# Patient Record
Sex: Male | Born: 1965 | Race: White | Hispanic: No | Marital: Married | Smoking: Never smoker
Health system: Southern US, Community
[De-identification: ages and names within clinical notes are randomized; demographics above are authoritative.]

## PROBLEM LIST (undated history)

## (undated) DIAGNOSIS — N200 Calculus of kidney: Secondary | ICD-10-CM

## (undated) DIAGNOSIS — J189 Pneumonia, unspecified organism: Secondary | ICD-10-CM

## (undated) DIAGNOSIS — T8859XA Other complications of anesthesia, initial encounter: Secondary | ICD-10-CM

## (undated) DIAGNOSIS — G4730 Sleep apnea, unspecified: Secondary | ICD-10-CM

## (undated) DIAGNOSIS — I10 Essential (primary) hypertension: Secondary | ICD-10-CM

## (undated) DIAGNOSIS — T4145XA Adverse effect of unspecified anesthetic, initial encounter: Secondary | ICD-10-CM

## (undated) HISTORY — DX: Calculus of kidney: N20.0

## (undated) HISTORY — PX: CHOLECYSTECTOMY: SHX55

---

## 2008-02-13 ENCOUNTER — Ambulatory Visit: Payer: Self-pay | Admitting: Family Medicine

## 2008-02-13 DIAGNOSIS — L0231 Cutaneous abscess of buttock: Secondary | ICD-10-CM

## 2008-02-13 DIAGNOSIS — L03317 Cellulitis of buttock: Secondary | ICD-10-CM | POA: Insufficient documentation

## 2008-03-02 ENCOUNTER — Ambulatory Visit: Payer: Self-pay | Admitting: Family Medicine

## 2008-03-02 DIAGNOSIS — R197 Diarrhea, unspecified: Secondary | ICD-10-CM

## 2008-03-02 DIAGNOSIS — R112 Nausea with vomiting, unspecified: Secondary | ICD-10-CM

## 2008-03-03 ENCOUNTER — Inpatient Hospital Stay (HOSPITAL_COMMUNITY): Admission: EM | Admit: 2008-03-03 | Discharge: 2008-03-05 | Payer: Self-pay | Admitting: Internal Medicine

## 2008-03-03 ENCOUNTER — Encounter: Payer: Self-pay | Admitting: Emergency Medicine

## 2008-03-03 ENCOUNTER — Telehealth: Payer: Self-pay | Admitting: Family Medicine

## 2008-03-03 ENCOUNTER — Ambulatory Visit: Payer: Self-pay | Admitting: Diagnostic Radiology

## 2009-12-13 ENCOUNTER — Ambulatory Visit: Payer: Self-pay | Admitting: Emergency Medicine

## 2009-12-13 DIAGNOSIS — M25569 Pain in unspecified knee: Secondary | ICD-10-CM | POA: Insufficient documentation

## 2009-12-16 ENCOUNTER — Encounter: Admission: RE | Admit: 2009-12-16 | Discharge: 2010-01-18 | Payer: Self-pay | Admitting: Emergency Medicine

## 2009-12-20 ENCOUNTER — Encounter: Payer: Self-pay | Admitting: Emergency Medicine

## 2010-04-04 NOTE — Letter (Signed)
Summary: REFERRALL TO ORTHOPEDIC  REFERRALL TO ORTHOPEDIC   Imported By: Dannette Barbara 12/13/2009 11:33:14  _____________________________________________________________________  External Attachment:    Type:   Image     Comment:   External Document

## 2010-04-04 NOTE — Letter (Signed)
Summary: REHAB SUMMARY NOTES  REHAB SUMMARY NOTES   Imported By: Dannette Barbara 12/20/2009 16:52:20  _____________________________________________________________________  External Attachment:    Type:   Image     Comment:   External Document

## 2010-04-04 NOTE — Assessment & Plan Note (Signed)
Summary: LEFT KNEEN PAIN   Vital Signs:  Patient Profile:   45 Years Old Male CC:      left posterior knee pain x 4 weeks Height:     71 inches Weight:      285 pounds O2 Sat:      99 % O2 treatment:    Room Air Temp:     98.0 degrees F oral Pulse rate:   98 / minute Resp:     16 per minute BP sitting:   164 / 99  (left arm) Cuff size:   large  Pt. in pain?   yes    Location:   left knee    Type:       sharp  Vitals Entered By: Lajean Saver RN (December 13, 2009 10:19 AM)                   Updated Prior Medication List: No Medications Current Allergies (reviewed today): No known allergies History of Present Illness History from: patient Chief Complaint: left posterior knee pain x 4 weeks History of Present Illness: L knee pain for 4 weeks.  Was playing basketball but doesn't recall an injury.  Then a few days later developed pain on the back side of his knee that hasn't gone away.  Then yesterday was kicking outside, planted foot and felt further pain in the same area.  +swelling, popping.  no locking, giving way.  Aleve helps a little.   REVIEW OF SYSTEMS Constitutional Symptoms      Denies fever, chills, night sweats, weight loss, weight gain, and fatigue.  Eyes       Denies change in vision, eye pain, eye discharge, glasses, contact lenses, and eye surgery. Ear/Nose/Throat/Mouth       Denies hearing loss/aids, change in hearing, ear pain, ear discharge, dizziness, frequent runny nose, frequent nose bleeds, sinus problems, sore throat, hoarseness, and tooth pain or bleeding.  Respiratory       Denies dry cough, productive cough, wheezing, shortness of breath, asthma, bronchitis, and emphysema/COPD.  Cardiovascular       Denies murmurs, chest pain, and tires easily with exhertion.    Gastrointestinal       Denies stomach pain, nausea/vomiting, diarrhea, constipation, blood in bowel movements, and indigestion. Genitourniary       Denies painful urination, blood or  discharge from penis, kidney stones, and loss of urinary control. Neurological       Denies paralysis, seizures, and fainting/blackouts. Musculoskeletal       Complains of joint pain and joint stiffness.      Denies muscle pain, decreased range of motion, redness, swelling, muscle weakness, and gout.      Comments: left knee Skin       Denies bruising, unusual mles/lumps or sores, and hair/skin or nail changes.  Psych       Denies mood changes, temper/anger issues, anxiety/stress, speech problems, depression, and sleep problems. Other Comments: Patient played a game of basketball about 4 weeks ago. Ever since the next AM he has experienced left posterior knee pain daily. Worse in the evening.   Past History:  Past Medical History: Reviewed history from 02/13/2008 and no changes required. Kidney stones  Past Surgical History: Reviewed history from 02/13/2008 and no changes required. Cholecystectomy  Family History: Reviewed history from 02/13/2008 and no changes required. Mother, Healthy Father, Healthy Brothers, Healthy Sister, Healthy  Social History: Reviewed history from 02/13/2008 and no changes required. 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  Patella freely mobile, Clarks compression test normal.  Good alignment.  + Thessaly test.  +TTP along medial joint line and posteriormedial.  Assessment New Problems: KNEE PAIN, LEFT (ICD-719.46)  this is likely medial meniscus injury Xray is normal, slight loss of medial  joint space, + fabella  Patient Education: Patient and/or caregiver instructed in the following: rest, Tylenol prn, weight loss.  Plan New Medications/Changes: MELOXICAM 7.5 MG TABS (MELOXICAM) 1 by mouth two times a day for 10 days, then as needed  #40 x 0, 12/13/2009, Hoyt Koch MD  New Orders: Est. Patient Level IV [09811] T-DG Knee Complete 4 Views*L* [91478] Planning Comments:   Will send to PT for a few weeks.  Also to take Mobic. Ice frequently. Will send to [facility] in a few weeks for re-evaluation    The patient and/or caregiver has been counseled thoroughly with regard to medications prescribed including dosage, schedule, interactions, rationale for use, and possible side effects and they verbalize understanding.  Diagnoses and expected course of recovery discussed and will return if not improved as expected or if the condition worsens. Patient and/or caregiver verbalized understanding.  Prescriptions: MELOXICAM 7.5 MG TABS (MELOXICAM) 1 by mouth two times a day for 10 days, then as needed  #40 x 0   Entered and Authorized by:   Hoyt Koch MD   Signed by:   Hoyt Koch MD on 12/13/2009   Method used:   Print then Give to Patient   RxID:   [id number]   Orders Added: 1)  Est. Patient Level IV [62952] 2)  T-DG Knee Complete 4 Views*L* [84132]

## 2010-06-19 LAB — CBC
MCHC: 34.6 g/dL (ref 30.0–36.0)
MCV: 92.1 fL (ref 78.0–100.0)
Platelets: 210 10*3/uL (ref 150–400)
RDW: 13.1 % (ref 11.5–15.5)

## 2010-06-19 LAB — BASIC METABOLIC PANEL
BUN: 5 mg/dL — ABNORMAL LOW (ref 6–23)
CO2: 27 mEq/L (ref 19–32)
Chloride: 106 mEq/L (ref 96–112)
Creatinine, Ser: 0.92 mg/dL (ref 0.4–1.5)
Glucose, Bld: 105 mg/dL — ABNORMAL HIGH (ref 70–99)

## 2010-06-19 LAB — LIPASE, BLOOD: Lipase: 59 U/L (ref 11–59)

## 2010-06-19 LAB — CALCIUM, IONIZED: Calcium, Ion: 1.15 mmol/L (ref 1.12–1.32)

## 2010-07-18 NOTE — Discharge Summary (Signed)
Christopher Berry, STMARIE NO.:  [id number]   MEDICAL RECORD NO.:  [id number]          PATIENT TYPE:  INP   LOCATION:  1525                         FACILITY:  [facility]   PHYSICIAN:  Ladell Pier, M.D.   DATE OF BIRTH:  26-Dec-1965   DATE OF ADMISSION:  03/03/2008  DATE OF DISCHARGE:  03/05/2008                               DISCHARGE SUMMARY   DISCHARGE DIAGNOSES:  1. Partial small bowel obstruction.  2. Hypocalcemia.  3. Mildly elevated blood pressure.   DISCHARGE MEDICATIONS:  Phenergan p.r.n.   PROCEDURES:  None.   CONSULTANTS:  General surgery, Dr. Derrell Lolling.   HISTORY OF PRESENT ILLNESS:  The patient is a 45 year old Caucasian  gentleman with no significant past medical history except for remote  history of cholecystectomy.  Patient awakened from sleep about 4:00 a.m.  with diarrhea and vomiting.  He had two episode of profuse diarrhea,  since then he has been having colicky abdominal pain and vomiting.  He  has not changed his diet any.  Please see admission note for remainder  of history.   Past medical history, family history, social history, medications,  allergies, review of systems per admission H and P.   PHYSICAL EXAM:  Temperature 98, pulse of 86, respirations 18, blood  pressure 144/92, pulse ox 98% on room air.  GENERAL:  The patient lying on bed, does not seem to be in any acute  distress.  HEENT:  Normocephalic, atraumatic.  Pupils reactive to light.  Throat  without erythema.  CARDIOVASCULAR:  Regular rate and rhythm.  LUNGS:  Clear bilaterally.  ABDOMEN:  Positive bowel sounds.  Soft, nontender, nondistended.  EXTREMITIES:  Without edema.   HOSPITAL COURSE:  1. Partial small-bowel obstruction.  The patient was admitted to the      hospital, placed on IV fluids, made NPO.  General surgery was      consulted.  The patient had repeat x-ray done on January 1 that      shows substantial improvement in the small bowel distention.  The  patient's abdominal pain resolved.  He is able to tolerate a      regular diet without any nausea, vomiting or abdominal pain.  Per      general surgery's recommendation will discharge the patient home.      He will follow up with his primary care doctor in 1 week.  2. Mildly elevated LFTs.  Discussed this with the patient.  He will      also follow up with his doctor regarding mildly elevated LFTs for      repeat LFTs.  3. Anemia:  The patient's hemoglobin is 11.8, not sure if this is      hemodilution.  The patient will follow up with his primary care      doctor for outpatient workup of the anemia.  Of note, on admission      his hemoglobin was 15.7 though this probably is hemodilution since      he has been on lots of IV fluids.  4. Mildly elevated blood pressure:  The patient's  blood pressure has      one reading that was mildly elevated.  This also could be from the      increased IV fluids.  The patient will follow up with primary care      physician to recheck his blood pressure.   DISCHARGE LABS:  TSH 2.202, ionized calcium 1.15, lipase of 59, sodium  137, potassium 3.8, chloride 106, CO2 27, glucose 105, BUN 5, creatinine  0.92, calcium 8, WBC 4.2, hemoglobin 11.8, MCV 92.1, platelet 210,  magnesium of 1.9, urinalysis negative.  X-ray scan showed substantial  interval improvement in small bowel distention seen on yesterday's  study.  Initial x-ray showed loops mid abdomen with some air-fluid  levels consistent with partial small bowel obstruction or ileus.      Ladell Pier, M.D.  Electronically Signed     NJ/MEDQ  D:  03/05/2008  T:  03/06/2008  Job:  [id number]

## 2010-07-18 NOTE — Consult Note (Signed)
NAMEOBED, SAMEK NO.:  [id number]   MEDICAL RECORD NO.:  [id number]          PATIENT TYPE:  INP   LOCATION:  1525                         FACILITY:  [facility]   PHYSICIAN:  Angelia Mould. Derrell Lolling, M.D.DATE OF BIRTH:  [birth year]   DATE OF CONSULTATION:  DATE OF DISCHARGE:                                 CONSULTATION   REASON FOR CONSULTATION:  Evaluate abdominal pain, possible small bowel  obstruction.   HISTORY OF PRESENT ILLNESS:  This is a 45 year old white man, previously  healthy.  At 4:00 a.m. on Tuesday, December 29, he awoke at 4:00 a.m.  with sudden onset of large volume nonbloody diarrhea.  Was not having  any pain or nausea at that time.  Over the next hour or two, he had a  couple of large nonbloody stools.  Again, with no pain or fever.  Later  in the day, he developed repeated episodes of nausea and vomiting of  light bilious fluid.  Yesterday, he had three more episodes of nausea  and vomiting.  Continued to have loose stools, but they were of much  smaller volume.  He developed a constant soreness in his abdomen which  was not severe.  There was not colicky pain.  He has never had a problem  like this before.  Has not had any fever or chills.  Has been voiding  normally.  Has been breathing normally.  No cough.  He denies any recent  travel or any unusual diet.   He came to emergency room last night.  CT scan was obtained which showed  some mildly dilated small bowel, but no focal transition point or  inflammatory change.  Small-bowel obstruction could not be excluded.  Because of this, he was admitted by the medical team because they could  not exclude small-bowel obstruction.  They called me for consultation.   This morning, the patient feels much better and says that his pain is  almost gone and the nausea and vomiting has completely resolved and he  thinks that he could eat.   PAST HISTORY:  1. Laparoscopic cholecystectomy in the early  1990s.  2. Gluteal repair of rectal abscess in the past.  3. Otherwise, no medical or surgical problems.   MEDICATIONS:  None.   DRUG ALLERGIES:  None known.   SOCIAL HISTORY:  He has been previously married.  His girlfriend/fiance  is here with him now.  He denies alcohol or tobacco.  He has two  children.  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   FAMILY HISTORY:  Noncontributory.   REVIEW OF SYSTEMS:  All systems reviewed.  They are noncontributory  except as described above.   PHYSICAL EXAMINATION:  GENERAL:  Pleasant, large man in no distress.  Stated height 511, stated weight 271 pounds, temperature 97.1, heart  rate 86, respiratory rate 18, blood pressure 125/86, oxygen saturation  97% room air.  HEENT:  Eyes:  Sclerae clear.  Extraocular movements intact.  Ears,  Mouth, Throat, Nose, Tongue and Oropharynx are without gross lesions.  NECK:  Supple, nontender.  No obvious mass.  Very large neck.  No  jugular distention.  LUNGS:  Clear to auscultation.  No chest wall tenderness.  HEART:  Regular rate and rhythm.  No murmur.  Radial and femoral pulses  are palpable.  ABDOMEN:  Obese, fairly soft.  Bowel sounds are present, they do not  sound that abnormal.  A well-healed laparoscopy trocar site scars.  There are no masses.  There are no hernias in the abdomen or groin.  Liver, spleen are not enlarged.  He has mild diffuse tenderness to deep  palpation, but no peritoneal signs.  EXTREMITIES:  Moves all four extremities without pain or deformity.  NEUROLOGIC:  No gross motor sensory deficits.   ADMISSION DATA:  CT scan described above.  Hemoglobin was 15.7 yesterday  and is 11.9 this morning.  White blood cell count is 5900.  Complete  metabolic panel is normal except for mild elevation of SGOT and SGPT.  Lipase is 80.   ASSESSMENT:  1. Abdominal pain, nausea, vomiting, diarrhea.  Clinically,      gastroenteritis seems much more likely than small-bowel       obstruction.  2. Mild elevation of liver function test of uncertain etiology.   RECOMMENDATIONS:  1. I would repeat his abdominal x-rays today, and if they have      improved, I would allow liquid diet.  2. I would repeat lab work and abdominal x-rays tomorrow morning, and      if he remains improving, he probably could go home.  I will follow      along with you.  3. There is no indication for surgical intervention at this time.      Angelia Mould. Derrell Lolling, M.D.  Electronically Signed     HMI/MEDQ  D:  03/04/2008  T:  03/04/2008  Job:  [id number]   cc:   Dr. Sonny Masters Internal Medicine Team

## 2010-07-18 NOTE — H&P (Signed)
Christopher Berry, HINEY.:  [id number]   MEDICAL RECORD NO.:  [id number]          PATIENT TYPE:  INP   LOCATION:  1525                         FACILITY:  [facility]   PHYSICIAN:  Vania Rea, M.D. DATE OF BIRTH:  04-22-1965   DATE OF ADMISSION:  03/03/2008  DATE OF DISCHARGE:                              HISTORY & PHYSICAL   PRIMARY CARE PHYSICIAN:  Unassigned.   CHIEF COMPLAINT:  Abdominal pain and vomiting since yesterday morning.   HISTORY OF PRESENT ILLNESS:  This is a 45 year old, obese, Caucasian  gentleman with no significant past medical history other than remote  cholecystectomy about 20 years ago who was in good baseline state of  health until he was awakened from sleep about 4 a.m. yesterday morning  with diarrhea and vomiting.  The patient had two episodes of perfuse  diarrhea and since then has been having colicky abdominal pain and  vomiting with passage of scant amounts of stool.  The patient lives with  his girlfriend.  They have been eating pretty much the same thing.  She  has no symptoms.  The patient went to an Urgent Care facility where he  received an injection of Phenergan as well as Phenergan p.r.n.  This  gave no relief and he went to the freestanding emergency room at [facility] where he was evaluated.  A CT scan of the abdomen was done which  showed partial small bowel obstruction and the hospitalist service was  contacted for admission.   The patient has no fever, cough or cold and denies chest pain.  He has  been passing reduced amounts of urine and has been having dysuria.   He has no history of diabetes but his younger brother does have  diabetes.   PAST MEDICAL HISTORY:  Nil.   PAST SURGICAL HISTORY:  Laparoscopic cholecystectomy.   MEDICATIONS:  Phenergan for the past 24 hours; otherwise nil.   ALLERGIES:  No known drug allergies.   SOCIAL HISTORY:  Denies tobacco, alcohol or illicit drug use.  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   FAMILY HISTORY:  Significant only for a younger brother with diabetes,  type 1.   REVIEW OF SYSTEMS:  Other than noted above, a 10-point review of systems  is unremarkable.   PHYSICAL EXAMINATION:  GENERAL:  Obese, young, Caucasian gentleman,  lying in the stretcher.  Appears to be in some painful distress,  especially when he tries to sit up.  VITAL SIGNS:  Temperature 97,  pulse 96, respirations 18, blood pressure  143/85.  Oxygen saturation 97% on room air.  HEENT:  Pupils are round and equal.  Mucous membranes are pink and  anicteric.  He is not dehydrated.  He has received already 4 L of fluid.  NECK:  He has no cervical lymphadenopathy.  He has a thick neck.  No  jugular venous distension appreciated.  No carotid bruits.  CHEST:  Clear to auscultation bilaterally.  CARDIOVASCULAR:  Regular rhythm.  ABDOMEN:  Diffusely tender.  Distended by fat.  He appears  to have  somewhat decreased bowel sounds.  No masses are appreciated.  Umbilicus  is not inverted.  EXTREMITIES:  Without edema.  He has 2+ dorsalis pedis pulses  bilaterally.  He has no bony joint deformity and no skin lesions.  CENTRAL NERVOUS SYSTEM:  Cranial nerves II-XII grossly intact.  He has  no focal neurologic deficits.   LABORATORY DATA:  CBC is unremarkable.  He has no leukocytosis.  No  thrombocytosis.  He has a normal differential.  Serum chemistry is  likewise unremarkable with the exception of a BUN of 15 and a creatinine  of 1.4.  Otherwise his chemistry is normal.  Serum glucose is 140.  Serum lipase is 80.  His urinalysis showed specific gravity of 1.025, 15  ketones, 30 of protein  and is otherwise unremarkable.  CT scan of the  abdomen and pelvis with IV contrast shows findings compatible with early  partial small bowel obstruction.  Specific site or etiology is not  demonstrated.  No evidence of perforation or abscess. There is a non-  obstructing right renal  calculus. Hepatic steatosis noted.  Small  nodular densities at both lung bases, likely post inflammatory.  CT scan  of the pelvis shows no significant pelvic findings.   ASSESSMENT:  1. Partial small bowel obstruction.  2. Gastroenteritis less likely.   PLAN:  Will admit this gentleman for continued hydration. Keep him NPO  and pass an NG tube for decompression of the gastrointestinal tract.  When drainage has subsided, the patient can be started on a liquid diet.  Will go ahead and  consult surgical service early for co-management.  Will check his hemoglobin A1c because of family history of  hyperglycemia.  His urinalysis and urine microscopy are not suggestive  of infection and we probably will not investigate it further.  Other  plans as per orders.      Vania Rea, M.D.  Electronically Signed     LC/MEDQ  D:  03/03/2008  T:  03/04/2008  Job:  [id number]

## 2010-12-08 LAB — COMPREHENSIVE METABOLIC PANEL
Alkaline Phosphatase: 67 U/L (ref 39–117)
BUN: 15 mg/dL (ref 6–23)
Glucose, Bld: 140 mg/dL — ABNORMAL HIGH (ref 70–99)
Potassium: 3.7 mEq/L (ref 3.5–5.1)
Total Bilirubin: 0.9 mg/dL (ref 0.3–1.2)
Total Protein: 8.3 g/dL (ref 6.0–8.3)

## 2010-12-08 LAB — CBC
HCT: 35 % — ABNORMAL LOW (ref 39.0–52.0)
HCT: 46.8 % (ref 39.0–52.0)
Hemoglobin: 11.9 g/dL — ABNORMAL LOW (ref 13.0–17.0)
Hemoglobin: 15.7 g/dL (ref 13.0–17.0)
MCHC: 33.5 g/dL (ref 30.0–36.0)
MCHC: 34.1 g/dL (ref 30.0–36.0)
MCV: 91.1 fL (ref 78.0–100.0)
RDW: 12.8 % (ref 11.5–15.5)
RDW: 13.3 % (ref 11.5–15.5)

## 2010-12-08 LAB — DIFFERENTIAL
Basophils Absolute: 0.2 10*3/uL — ABNORMAL HIGH (ref 0.0–0.1)
Basophils Relative: 2 % — ABNORMAL HIGH (ref 0–1)
Monocytes Relative: 6 % (ref 3–12)
Neutro Abs: 7.5 10*3/uL (ref 1.7–7.7)
Neutrophils Relative %: 76 % (ref 43–77)

## 2010-12-08 LAB — BASIC METABOLIC PANEL
CO2: 27 mEq/L (ref 19–32)
Glucose, Bld: 108 mg/dL — ABNORMAL HIGH (ref 70–99)
Potassium: 4.3 mEq/L (ref 3.5–5.1)
Sodium: 138 mEq/L (ref 135–145)

## 2010-12-08 LAB — URINALYSIS, ROUTINE W REFLEX MICROSCOPIC
Leukocytes, UA: NEGATIVE
Nitrite: NEGATIVE
Protein, ur: 30 mg/dL — AB
Urobilinogen, UA: 0.2 mg/dL (ref 0.0–1.0)

## 2010-12-08 LAB — URINE MICROSCOPIC-ADD ON

## 2011-02-15 ENCOUNTER — Encounter: Payer: Self-pay | Admitting: Family Medicine

## 2011-02-20 ENCOUNTER — Encounter: Payer: Self-pay | Admitting: Family Medicine

## 2011-02-20 ENCOUNTER — Ambulatory Visit (INDEPENDENT_AMBULATORY_CARE_PROVIDER_SITE_OTHER): Payer: BC Managed Care – PPO | Admitting: Family Medicine

## 2011-02-20 VITALS — BP 136/78 | HR 125 | Wt 293.0 lb

## 2011-02-20 DIAGNOSIS — Z0000 Encounter for general adult medical examination without abnormal findings: Secondary | ICD-10-CM

## 2011-02-20 NOTE — Progress Notes (Signed)
Subjective:    Patient ID: Christopher Berry, male    DOB: [birth year], 45 y.o.   MRN: [id number]  HPI Here to estab care and for CPE. Lesion on right buttock check for 4 year. Will spontaneously drina on its own from time to time. He says that he avoids touching it he can often go for 6 months without any problems. But as soon as he touched it,the next day it is swollen red and infected. He does sit and drive for long periods and says this does tend to aggravate it.   Has gained a lot of weight in the last 4 years after going to a sendentary job and drinks a lot. Works a full time and part time job. He is also concerned about his weight today. He wants to discuss some strategies to help him lose weight. He is not getting regular exercise. He drinks about 6 diet sodas per day. He admits he eats very poorly and often eats related night.     Review of Systems  Constitutional: Negative for fever, diaphoresis and unexpected weight change.  HENT: Negative for hearing loss, rhinorrhea, sneezing and tinnitus.   Eyes: Negative for visual disturbance.  Respiratory: Negative for cough and wheezing.   Cardiovascular: Negative for chest pain and palpitations.  Gastrointestinal: Negative for nausea, vomiting, diarrhea and blood in stool.  Genitourinary: Negative for dysuria and discharge.  Musculoskeletal: Positive for myalgias and arthralgias. Negative for joint swelling.  Skin: Negative for rash.  Neurological: Negative for headaches.  Hematological: Negative for adenopathy.  Psychiatric/Behavioral: Negative for sleep disturbance and dysphoric mood. The patient is not nervous/anxious.        BP 136/78  Pulse 125  Wt 293 lb (132.904 kg)    No Known Allergies  Past Medical History  Diagnosis Date  . Kidney stones     Past Surgical History  Procedure Date  . Cholecystectomy     in his 8s    History   Social History  . Marital Status: Single    Spouse Name: N/A    Number of Children: 2    . Years of Education: GED   Occupational History  . Scientist, [employer].    Social History Main Topics  . Smoking status: Never Smoker   . Smokeless tobacco: Not on file  . Alcohol Use: No  . Drug Use: No  . Sexually Active: Yes -- Male partner(s)   Other Topics Concern  . Not on file   Social History Narrative   6 cans of soda a day.  No regular exercise.  Divorced.      Family History  Problem Relation Age of Onset  . Diabetes Mother   . Diabetes Father   . Diabetes Brother     Objective:   Physical Exam  Constitutional: He is oriented to person, place, and time. He appears well-developed and well-nourished.  HENT:  Head: Normocephalic and atraumatic.  Right Ear: External ear normal.  Left Ear: External ear normal.  Nose: Nose normal.  Mouth/Throat: Oropharynx is clear and moist.  Eyes: Conjunctivae and EOM are normal. Pupils are equal, round, and reactive to light.  Neck: Normal range of motion. Neck supple. No thyromegaly present.  Cardiovascular: Normal rate, regular rhythm, normal heart sounds and intact distal pulses.   Pulmonary/Chest: Effort normal and breath sounds normal.  Abdominal: Soft. Bowel sounds are normal. He exhibits no distension and no mass. There is no tenderness. There is no rebound  and no guarding.  Musculoskeletal: Normal range of motion.  Lymphadenopathy:    He has no cervical adenopathy.  Neurological: He is alert and oriented to person, place, and time. He has normal reflexes.  Skin: Skin is warm and dry.       On his right inner buttock cheek approximately 2-3 cm from the gluteal crease he has a scarred indented area. Actually there are 2 of these areas right next to each other. There is no active drainage or edema or erythema. No active drainage. No rash or streaking. There is some hyperpigmentation of the skin there.  Psychiatric: He has a normal mood and affect. His behavior is normal. Judgment and thought content normal.           Assessment & Plan:  CPE - his exam is normal today except for the fact that he is overweight. Start a regular exercise program and make sure you are eating a healthy diet Try to eat 4 servings of dairy a day or take a calcium supplement (500mg  twice a day). Your vaccines are up to date.  Due for screening labs.   Obesity-we discussed working on cutting out the soda even though it is diet soda. We also discussed trying to establish some type of exercise routine. Start with 1-2 days per week for 15-20 minutes and then slowly build up. Again he works full-time in an additional part-time job so it does make it very difficult for him to find time to work out I explained this is extremely important. We also discussed working on calorie counting. There are some great prolapse also help retract her calories that can be helpful. We can also consider nutrition management if he is interested.  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 I discussed the option of seeing a general surgeon for removal. 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 He says he would like to think about it and let me know.

## 2011-02-20 NOTE — Patient Instructions (Signed)
Start a regular exercise program and make sure you are eating a healthy diet Try to eat 4 servings of dairy a day or take a calcium supplement (500mg twice a day). Your vaccines are up to date.   

## 2011-02-21 LAB — LIPID PANEL
Cholesterol: 153 mg/dL (ref 0–200)
Total CHOL/HDL Ratio: 3.8 Ratio

## 2011-02-21 LAB — COMPLETE METABOLIC PANEL WITH GFR
ALT: 35 U/L (ref 0–53)
AST: 17 U/L (ref 0–37)
Albumin: 4.5 g/dL (ref 3.5–5.2)
Alkaline Phosphatase: 55 U/L (ref 39–117)
BUN: 13 mg/dL (ref 6–23)
CO2: 26 mEq/L (ref 19–32)
Calcium: 9.1 mg/dL (ref 8.4–10.5)
Chloride: 105 mEq/L (ref 96–112)
Creat: 1.12 mg/dL (ref 0.50–1.35)
GFR, Est African American: >89 mL/min
GFR, Est Non African American: 79 mL/min
Glucose, Bld: 90 mg/dL (ref 70–99)
Potassium: 4.3 mEq/L (ref 3.5–5.3)
Sodium: 140 mEq/L (ref 135–145)
Total Bilirubin: 0.5 mg/dL (ref 0.3–1.2)
Total Protein: 6.8 g/dL (ref 6.0–8.3)

## 2012-05-01 ENCOUNTER — Emergency Department (INDEPENDENT_AMBULATORY_CARE_PROVIDER_SITE_OTHER): Payer: BC Managed Care – PPO

## 2012-05-01 ENCOUNTER — Other Ambulatory Visit (HOSPITAL_COMMUNITY): Payer: Self-pay | Admitting: Orthopaedic Surgery

## 2012-05-01 ENCOUNTER — Emergency Department
Admission: EM | Admit: 2012-05-01 | Discharge: 2012-05-01 | Disposition: A | Discharge status: Home / Self Care | Payer: BC Managed Care – PPO | Source: Home / Self Care | Attending: Family Medicine | Admitting: Family Medicine

## 2012-05-01 ENCOUNTER — Encounter: Payer: Self-pay | Admitting: Emergency Medicine

## 2012-05-01 ENCOUNTER — Encounter (HOSPITAL_COMMUNITY): Payer: Self-pay | Admitting: Respiratory Therapy

## 2012-05-01 ENCOUNTER — Ambulatory Visit: Payer: BC Managed Care – PPO | Admitting: Family Medicine

## 2012-05-01 DIAGNOSIS — J029 Acute pharyngitis, unspecified: Secondary | ICD-10-CM

## 2012-05-01 DIAGNOSIS — Z0389 Encounter for observation for other suspected diseases and conditions ruled out: Secondary | ICD-10-CM

## 2012-05-01 MED ORDER — PREDNISONE 50 MG PO TABS: ORAL_TABLET | ORAL | Status: DC

## 2012-05-01 MED ORDER — CEFTRIAXONE SODIUM 1 G IJ SOLR
1.0000 g | Freq: Once | INTRAMUSCULAR | Status: AC
  Administered 2012-05-01: 1 g via INTRAMUSCULAR

## 2012-05-01 NOTE — ED Provider Notes (Signed)
History     CSN: [id number]  Arrival date & time 05/01/12  1618   First MD Initiated Contact with Patient 05/01/12 1623      Chief Complaint  Patient presents with  . Sore Throat    HPI  Patient presents today with sore throat x3 days. Patient is known to have broken his arm earlier in the week. Patient was placed on ibuprofen and Percocet. Patient states that he took ibuprofen he noticed sore throat as well as generalized neck swelling. Patient stopped the medication. Swelling improved status post Benadryl use. However her sore throat has persisted. Patient denies any fevers or chills. Patient does have some difficulty with swallowing. No shortness of breath no drooling trismus. No nuchal rigidity.  Past Medical History  Diagnosis Date  . Kidney stones     Past Surgical History  Procedure Laterality Date  . Cholecystectomy      in his 74s    Family History  Problem Relation Age of Onset  . Diabetes Mother   . Diabetes Father   . Diabetes Brother     History  Substance Use Topics  . Smoking status: Never Smoker   . Smokeless tobacco: Not on file  . Alcohol Use: No      Review of Systems  All other systems reviewed and are negative.    Allergies  Codeine  Home Medications   Current Outpatient Rx  Name  Route  Sig  Dispense  Refill  . diphenhydrAMINE (BENADRYL) 25 mg capsule   Oral   Take 25 mg by mouth every 6 (six) hours as needed for itching.         Marland Kitchen oxyCODONE-acetaminophen (PERCOCET/ROXICET) 5-325 MG per tablet   Oral   Take 1 tablet by mouth every 4 (four) hours as needed for pain.           BP 150/107  Pulse 94  Temp(Src) 98.1 F (36.7 C) (Oral)  Ht 6' (1.829 m)  Wt 278 lb (126.1 kg)  BMI 37.7 kg/m2  SpO2 97%  Physical Exam  Constitutional: He appears well-developed and well-nourished.  HENT:  Head: Normocephalic and atraumatic.  Right Ear: External ear normal.  Left Ear: External ear normal.  + post oropharyngeal erythema and  mild soft tissue swelling  No uvula deviation    Eyes: Conjunctivae are normal. Pupils are equal, round, and reactive to light.  Neck: Normal range of motion. Neck supple.  Cardiovascular: Normal rate, regular rhythm and normal heart sounds.   Pulmonary/Chest: Effort normal and breath sounds normal.  Abdominal: Soft.  Musculoskeletal: Normal range of motion.  Lymphadenopathy:    He has no cervical adenopathy.  Neurological: He is alert.  Skin: Skin is warm.    ED Course  Procedures (including critical care time)  Labs Reviewed  POCT RAPID STREP A (OFFICE)   Dg Neck Soft Tissue  05/01/2012  *RADIOLOGY REPORT*  Clinical Data: Pharyngitis. Evaluate for possible epiglottitis.  NECK SOFT TISSUES - 1+ VIEW  Comparison:  None.  Findings:  There is no evidence of retropharyngeal soft tissue swelling or epiglottic enlargement.  The cervical airway is unremarkable and no radio-opaque foreign body identified.  IMPRESSION: Negative.   Original Report Authenticated By: Davonna Belling, M.D.      1. Sore throat       MDM  No imaging evidence of epiglottitis.  Rapid strep negative.  Will cover for infectious and non infectious sources with rocephin (1gm IMx1) and prednisone with plan for ENT reeval  in am.  Discussed ENT and airway red flags at length that would warrant emergent evaluation.  Follow up as needed.     The patient and/or caregiver has been counseled thoroughly with regard to treatment plan and/or medications prescribed including dosage, schedule, interactions, rationale for use, and possible side effects and they verbalize understanding. Diagnoses and expected course of recovery discussed and will return if not improved as expected or if the condition worsens. Patient and/or caregiver verbalized understanding.             Doree Albee, MD 05/01/12 [phone]

## 2012-05-01 NOTE — ED Notes (Signed)
Sore throat x 3 days

## 2012-05-04 ENCOUNTER — Telehealth: Payer: Self-pay | Admitting: Emergency Medicine

## 2012-05-05 ENCOUNTER — Encounter (HOSPITAL_COMMUNITY): Payer: Self-pay | Admitting: Surgery

## 2012-05-05 MED ORDER — DEXTROSE 5 % IV SOLN
3.0000 g | INTRAVENOUS | Status: AC
  Administered 2012-05-06: 3 g via INTRAVENOUS
  Filled 2012-05-05: qty 3000

## 2012-05-05 NOTE — Progress Notes (Signed)
Call completed. Patient denied having a stress test, cardiac cath, or sleep study. PCP is Dr. Nani Gasser; encounters in [facility].

## 2012-05-06 ENCOUNTER — Encounter (HOSPITAL_COMMUNITY): Payer: Self-pay | Admitting: *Deleted

## 2012-05-06 ENCOUNTER — Ambulatory Visit (HOSPITAL_COMMUNITY): Payer: Worker's Compensation | Admitting: Anesthesiology

## 2012-05-06 ENCOUNTER — Encounter (HOSPITAL_COMMUNITY): Payer: Self-pay | Admitting: Anesthesiology

## 2012-05-06 ENCOUNTER — Encounter (HOSPITAL_COMMUNITY)
Admission: RE | Disposition: A | Discharge status: Home / Self Care | Payer: Self-pay | Source: Ambulatory Visit | Attending: Orthopaedic Surgery

## 2012-05-06 ENCOUNTER — Observation Stay (HOSPITAL_COMMUNITY)
Admission: RE | Admit: 2012-05-06 | Discharge: 2012-05-07 | Disposition: A | Discharge status: Home / Self Care | Payer: Worker's Compensation | Source: Ambulatory Visit | Attending: Orthopaedic Surgery | Admitting: Orthopaedic Surgery

## 2012-05-06 DIAGNOSIS — S52501A Unspecified fracture of the lower end of right radius, initial encounter for closed fracture: Secondary | ICD-10-CM

## 2012-05-06 DIAGNOSIS — W1789XA Other fall from one level to another, initial encounter: Secondary | ICD-10-CM | POA: Insufficient documentation

## 2012-05-06 DIAGNOSIS — Y990 Civilian activity done for income or pay: Secondary | ICD-10-CM | POA: Insufficient documentation

## 2012-05-06 DIAGNOSIS — S52599A Other fractures of lower end of unspecified radius, initial encounter for closed fracture: Principal | ICD-10-CM | POA: Insufficient documentation

## 2012-05-06 HISTORY — PX: OPEN REDUCTION INTERNAL FIXATION (ORIF) DISTAL RADIAL FRACTURE: SHX5989

## 2012-05-06 HISTORY — DX: Adverse effect of unspecified anesthetic, initial encounter: T41.45XA

## 2012-05-06 HISTORY — DX: Pneumonia, unspecified organism: J18.9

## 2012-05-06 HISTORY — DX: Other complications of anesthesia, initial encounter: T88.59XA

## 2012-05-06 LAB — SURGICAL PCR SCREEN: MRSA, PCR: INVALID — AB

## 2012-05-06 LAB — CBC
Hemoglobin: 14.2 g/dL (ref 13.0–17.0)
MCH: 31.3 pg (ref 26.0–34.0)
Platelets: 366 10*3/uL (ref 150–400)
RBC: 4.53 MIL/uL (ref 4.22–5.81)
WBC: 14 10*3/uL — ABNORMAL HIGH (ref 4.0–10.5)

## 2012-05-06 SURGERY — OPEN REDUCTION INTERNAL FIXATION (ORIF) DISTAL RADIUS FRACTURE
Anesthesia: General | Laterality: Right | Wound class: Clean

## 2012-05-06 MED ORDER — ACETAMINOPHEN 10 MG/ML IV SOLN
INTRAVENOUS | Status: AC
  Administered 2012-05-06: 1000 mg via INTRAVENOUS
  Filled 2012-05-06: qty 100

## 2012-05-06 MED ORDER — HYDROMORPHONE HCL PF 1 MG/ML IJ SOLN
INTRAMUSCULAR | Status: AC
  Filled 2012-05-06: qty 1

## 2012-05-06 MED ORDER — SODIUM CHLORIDE 0.9 % IV SOLN
INTRAVENOUS | Status: DC
  Administered 2012-05-06: 21:00:00 via INTRAVENOUS

## 2012-05-06 MED ORDER — MUPIROCIN 2 % EX OINT
TOPICAL_OINTMENT | CUTANEOUS | Status: AC
  Filled 2012-05-06: qty 22

## 2012-05-06 MED ORDER — METHOCARBAMOL 500 MG PO TABS
ORAL_TABLET | ORAL | Status: AC
  Filled 2012-05-06: qty 1

## 2012-05-06 MED ORDER — ROCURONIUM BROMIDE 100 MG/10ML IV SOLN
INTRAVENOUS | Status: DC | PRN
  Administered 2012-05-06: 30 mg via INTRAVENOUS

## 2012-05-06 MED ORDER — OXYCODONE HCL 5 MG PO TABS
ORAL_TABLET | ORAL | Status: AC
  Filled 2012-05-06: qty 1

## 2012-05-06 MED ORDER — NEOSTIGMINE METHYLSULFATE 1 MG/ML IJ SOLN
INTRAMUSCULAR | Status: DC | PRN
  Administered 2012-05-06: 3 mg via INTRAVENOUS

## 2012-05-06 MED ORDER — BIOTENE DRY MOUTH MT LIQD
15.0000 mL | Freq: Two times a day (BID) | OROMUCOSAL | Status: DC
  Administered 2012-05-07: 15 mL via OROMUCOSAL

## 2012-05-06 MED ORDER — LACTATED RINGERS IV SOLN
INTRAVENOUS | Status: DC
  Administered 2012-05-06 (×3): via INTRAVENOUS

## 2012-05-06 MED ORDER — HYDROCODONE-ACETAMINOPHEN 5-325 MG PO TABS
1.0000 | ORAL_TABLET | ORAL | Status: DC | PRN
  Administered 2012-05-06 – 2012-05-07 (×3): 2 via ORAL
  Filled 2012-05-06 (×3): qty 2

## 2012-05-06 MED ORDER — CEFAZOLIN SODIUM 1-5 GM-% IV SOLN
1.0000 g | Freq: Four times a day (QID) | INTRAVENOUS | Status: DC
  Administered 2012-05-06 – 2012-05-07 (×2): 1 g via INTRAVENOUS
  Filled 2012-05-06 (×3): qty 50

## 2012-05-06 MED ORDER — BUPIVACAINE HCL (PF) 0.25 % IJ SOLN
INTRAMUSCULAR | Status: AC
  Filled 2012-05-06: qty 30

## 2012-05-06 MED ORDER — ZOLPIDEM TARTRATE 5 MG PO TABS: 5.0000 mg | ORAL_TABLET | Freq: Every evening | ORAL | Status: DC | PRN

## 2012-05-06 MED ORDER — METHOCARBAMOL 500 MG PO TABS
500.0000 mg | ORAL_TABLET | Freq: Four times a day (QID) | ORAL | Status: DC | PRN
  Administered 2012-05-06: 500 mg via ORAL
  Filled 2012-05-06: qty 1

## 2012-05-06 MED ORDER — LIDOCAINE HCL 1 % IJ SOLN
INTRAMUSCULAR | Status: DC | PRN
  Administered 2012-05-06: 80 mg via INTRADERMAL

## 2012-05-06 MED ORDER — ONDANSETRON HCL 4 MG/2ML IJ SOLN: 4.0000 mg | Freq: Four times a day (QID) | INTRAMUSCULAR | Status: DC | PRN

## 2012-05-06 MED ORDER — HYDROMORPHONE HCL PF 1 MG/ML IJ SOLN
0.2500 mg | INTRAMUSCULAR | Status: DC | PRN
  Administered 2012-05-06 (×2): 0.5 mg via INTRAVENOUS

## 2012-05-06 MED ORDER — MIDAZOLAM HCL 5 MG/5ML IJ SOLN
INTRAMUSCULAR | Status: DC | PRN
  Administered 2012-05-06: 2 mg via INTRAVENOUS

## 2012-05-06 MED ORDER — ONDANSETRON HCL 4 MG/2ML IJ SOLN: 4.0000 mg | Freq: Once | INTRAMUSCULAR | Status: DC | PRN

## 2012-05-06 MED ORDER — PROPOFOL 10 MG/ML IV BOLUS
INTRAVENOUS | Status: DC | PRN
  Administered 2012-05-06: 300 mg via INTRAVENOUS

## 2012-05-06 MED ORDER — METOCLOPRAMIDE HCL 5 MG/ML IJ SOLN
5.0000 mg | Freq: Three times a day (TID) | INTRAMUSCULAR | Status: DC | PRN
  Filled 2012-05-06: qty 2

## 2012-05-06 MED ORDER — METOCLOPRAMIDE HCL 10 MG PO TABS: 5.0000 mg | ORAL_TABLET | Freq: Three times a day (TID) | ORAL | Status: DC | PRN

## 2012-05-06 MED ORDER — FENTANYL CITRATE 0.05 MG/ML IJ SOLN
INTRAMUSCULAR | Status: DC | PRN
  Administered 2012-05-06 (×2): 50 ug via INTRAVENOUS
  Administered 2012-05-06: 150 ug via INTRAVENOUS

## 2012-05-06 MED ORDER — OXYCODONE HCL 5 MG PO TABS
5.0000 mg | ORAL_TABLET | ORAL | Status: DC | PRN
  Administered 2012-05-06: 5 mg via ORAL

## 2012-05-06 MED ORDER — DIPHENHYDRAMINE HCL 12.5 MG/5ML PO ELIX
12.5000 mg | ORAL_SOLUTION | ORAL | Status: DC | PRN
  Filled 2012-05-06: qty 10

## 2012-05-06 MED ORDER — MORPHINE SULFATE 2 MG/ML IJ SOLN: 1.0000 mg | INTRAMUSCULAR | Status: DC | PRN

## 2012-05-06 MED ORDER — METHOCARBAMOL 100 MG/ML IJ SOLN
500.0000 mg | Freq: Four times a day (QID) | INTRAVENOUS | Status: DC | PRN
  Filled 2012-05-06: qty 5

## 2012-05-06 MED ORDER — ONDANSETRON HCL 4 MG/2ML IJ SOLN
INTRAMUSCULAR | Status: DC | PRN
  Administered 2012-05-06: 4 mg via INTRAVENOUS

## 2012-05-06 MED ORDER — BUPIVACAINE HCL (PF) 0.25 % IJ SOLN
INTRAMUSCULAR | Status: DC | PRN
  Administered 2012-05-06: 10 mL

## 2012-05-06 MED ORDER — GLYCOPYRROLATE 0.2 MG/ML IJ SOLN
INTRAMUSCULAR | Status: DC | PRN
  Administered 2012-05-06: 0.4 mg via INTRAVENOUS

## 2012-05-06 MED ORDER — MORPHINE SULFATE 10 MG/ML IJ SOLN
INTRAMUSCULAR | Status: DC | PRN
  Administered 2012-05-06: 3 mg via INTRAVENOUS
  Administered 2012-05-06: 4 mg via INTRAVENOUS
  Administered 2012-05-06: 3 mg via INTRAVENOUS

## 2012-05-06 MED ORDER — MUPIROCIN 2 % EX OINT
TOPICAL_OINTMENT | Freq: Two times a day (BID) | CUTANEOUS | Status: DC
  Administered 2012-05-06: 22:00:00 via NASAL
  Administered 2012-05-06: 1 via NASAL
  Filled 2012-05-06: qty 22

## 2012-05-06 MED ORDER — EPHEDRINE SULFATE 50 MG/ML IJ SOLN
INTRAMUSCULAR | Status: DC | PRN
  Administered 2012-05-06: 10 mg via INTRAVENOUS

## 2012-05-06 MED ORDER — 0.9 % SODIUM CHLORIDE (POUR BTL) OPTIME
TOPICAL | Status: DC | PRN
  Administered 2012-05-06: 1000 mL

## 2012-05-06 MED ORDER — LIDOCAINE HCL 4 % MT SOLN
OROMUCOSAL | Status: DC | PRN
  Administered 2012-05-06: 4 mL via TOPICAL

## 2012-05-06 MED ORDER — ONDANSETRON HCL 4 MG PO TABS: 4.0000 mg | ORAL_TABLET | Freq: Four times a day (QID) | ORAL | Status: DC | PRN

## 2012-05-06 SURGICAL SUPPLY — 54 items
BANDAGE ELASTIC 3 VELCRO ST LF (GAUZE/BANDAGES/DRESSINGS) ×2 IMPLANT
BANDAGE ELASTIC 4 VELCRO ST LF (GAUZE/BANDAGES/DRESSINGS) ×2 IMPLANT
BANDAGE GAUZE ELAST BULKY 4 IN (GAUZE/BANDAGES/DRESSINGS) ×2 IMPLANT
BIT DRILL 2 FAST STEP (BIT) ×2 IMPLANT
BIT DRILL 2.5X4 QC (BIT) ×2 IMPLANT
BNDG ESMARK 4X9 LF (GAUZE/BANDAGES/DRESSINGS) ×2 IMPLANT
CLOTH BEACON ORANGE TIMEOUT ST (SAFETY) ×2 IMPLANT
CORDS BIPOLAR (ELECTRODE) ×2 IMPLANT
COVER SURGICAL LIGHT HANDLE (MISCELLANEOUS) ×2 IMPLANT
CUFF TOURNIQUET SINGLE 18IN (TOURNIQUET CUFF) IMPLANT
CUFF TOURNIQUET SINGLE 24IN (TOURNIQUET CUFF) ×2 IMPLANT
DRAPE OEC MINIVIEW 54X84 (DRAPES) ×2 IMPLANT
DURAPREP 26ML APPLICATOR (WOUND CARE) ×2 IMPLANT
ELECT REM PT RETURN 9FT ADLT (ELECTROSURGICAL) ×2
ELECTRODE REM PT RTRN 9FT ADLT (ELECTROSURGICAL) ×1 IMPLANT
GAUZE XEROFORM 1X8 LF (GAUZE/BANDAGES/DRESSINGS) ×2 IMPLANT
GLOVE BIO SURGEON STRL SZ7.5 (GLOVE) ×2 IMPLANT
GLOVE BIOGEL PI IND STRL 8 (GLOVE) ×1 IMPLANT
GLOVE BIOGEL PI INDICATOR 8 (GLOVE) ×1
GLOVE ORTHO TXT STRL SZ7.5 (GLOVE) ×2 IMPLANT
GOWN PREVENTION PLUS LG XLONG (DISPOSABLE) ×2 IMPLANT
GOWN PREVENTION PLUS XLARGE (GOWN DISPOSABLE) ×4 IMPLANT
GOWN STRL NON-REIN LRG LVL3 (GOWN DISPOSABLE) ×2 IMPLANT
KIT BASIN OR (CUSTOM PROCEDURE TRAY) ×2 IMPLANT
KIT ROOM TURNOVER OR (KITS) ×2 IMPLANT
MANIFOLD NEPTUNE II (INSTRUMENTS) ×2 IMPLANT
NEEDLE 22X1 1/2 (OR ONLY) (NEEDLE) ×2 IMPLANT
NS IRRIG 1000ML POUR BTL (IV SOLUTION) ×2 IMPLANT
PACK ORTHO EXTREMITY (CUSTOM PROCEDURE TRAY) ×2 IMPLANT
PAD ARMBOARD 7.5X6 YLW CONV (MISCELLANEOUS) ×4 IMPLANT
PAD CAST 4YDX4 CTTN HI CHSV (CAST SUPPLIES) ×1 IMPLANT
PADDING CAST COTTON 4X4 STRL (CAST SUPPLIES) ×1
PEG SUBCHONDRAL SMOOTH 2.0X16 (Peg) ×2 IMPLANT
PEG SUBCHONDRAL SMOOTH 2.0X18 (Peg) ×4 IMPLANT
PEG SUBCHONDRAL SMOOTH 2.0X20 (Peg) ×4 IMPLANT
PEG SUBCHONDRAL SMOOTH 2.0X24 (Peg) ×4 IMPLANT
PLATE SHORT 24.4X51.3 RT (Plate) ×2 IMPLANT
SCREW CORT 3.5X14 LNG (Screw) ×4 IMPLANT
SCREW CORT 3.5X15 (Screw) ×2 IMPLANT
SPLINT PLASTER CAST XFAST 4X15 (CAST SUPPLIES) ×1 IMPLANT
SPLINT PLASTER XTRA FAST SET 4 (CAST SUPPLIES) ×1
SPONGE GAUZE 4X4 12PLY (GAUZE/BANDAGES/DRESSINGS) ×2 IMPLANT
SPONGE LAP 4X18 X RAY DECT (DISPOSABLE) ×2 IMPLANT
STRIP CLOSURE SKIN 1/2X4 (GAUZE/BANDAGES/DRESSINGS) ×2 IMPLANT
SUCTION FRAZIER TIP 10 FR DISP (SUCTIONS) ×2 IMPLANT
SUT PROLENE 3 0 PS 1 (SUTURE) IMPLANT
SUT VIC AB 3-0 X1 27 (SUTURE) IMPLANT
SUT VICRYL 4-0 PS2 18IN ABS (SUTURE) ×2 IMPLANT
SYR CONTROL 10ML LL (SYRINGE) ×2 IMPLANT
TOWEL OR 17X24 6PK STRL BLUE (TOWEL DISPOSABLE) ×2 IMPLANT
TOWEL OR 17X26 10 PK STRL BLUE (TOWEL DISPOSABLE) ×2 IMPLANT
TUBE CONNECTING 12X1/4 (SUCTIONS) ×2 IMPLANT
UNDERPAD 30X30 INCONTINENT (UNDERPADS AND DIAPERS) ×2 IMPLANT
WATER STERILE IRR 1000ML POUR (IV SOLUTION) ×2 IMPLANT

## 2012-05-06 NOTE — Progress Notes (Signed)
05/06/12 1330  OBSTRUCTIVE SLEEP APNEA  Have you ever been diagnosed with sleep apnea through a sleep study? No  Do you snore loudly (loud enough to be heard through closed doors)?  1  Do you often feel tired, fatigued, or sleepy during the daytime? 0  Has anyone observed you stop breathing during your sleep? 1  Do you have, or are you being treated for high blood pressure? 0  BMI more than 35 kg/m2? 1  Age over 46 years old? 0  Neck circumference greater than 40 cm/18 inches? 1  Gender: 1  Obstructive Sleep Apnea Score 5  Score 4 or greater  Results sent to PCP

## 2012-05-06 NOTE — Anesthesia Postprocedure Evaluation (Signed)
  Anesthesia Post-op Note  Patient: Christopher Berry  Procedure(s) Performed: Procedure(s): OPEN REDUCTION INTERNAL FIXATION (ORIF) Right DISTAL RADIUS FRACTURE (Right)  Patient Location: PACU  Anesthesia Type:General  Level of Consciousness: awake  Airway and Oxygen Therapy: Patient Spontanous Breathing  Post-op Pain: mild  Post-op Assessment: Post-op Vital signs reviewed, Patient's Cardiovascular Status Stable, Respiratory Function Stable, Patent Airway, No signs of Nausea or vomiting and Pain level controlled  Post-op Vital Signs: stable  Complications: No apparent anesthesia complications

## 2012-05-06 NOTE — Brief Op Note (Signed)
05/06/2012  5:00 PM  PATIENT:  Christopher Berry  47 y.o. male  PRE-OPERATIVE DIAGNOSIS:  Right intra-articular distal radius fx  POST-OPERATIVE DIAGNOSIS:  * No post-op diagnosis entered *  PROCEDURE:  Procedure(s): OPEN REDUCTION INTERNAL FIXATION (ORIF) Right DISTAL RADIUS FRACTURE (Right)  SURGEON:  Surgeon(s) and Role:    * Kathryne Hitch, MD - Primary  PHYSICIAN ASSISTANT:  Rexene Edison, PA-C  ASSISTANTS: Rexene Edison, PA-C   ANESTHESIA:   local and general  EBL:  Total I/O In: 1400 [I.V.:1400] Out: -   BLOOD ADMINISTERED:none  DRAINS: none   LOCAL MEDICATIONS USED:  MARCAINE     SPECIMEN:  No Specimen  DISPOSITION OF SPECIMEN:  N/A  COUNTS:  YES  TOURNIQUET:  * Missing tourniquet times found for documented tourniquets in log:  16109 *  DICTATION: .Other Dictation: Dictation Number [id number]  PLAN OF CARE: Admit for overnight observation  PATIENT DISPOSITION:  PACU - hemodynamically stable.   Delay start of Pharmacological VTE agent (>24hrs) due to surgical blood loss or risk of bleeding: not applicable

## 2012-05-06 NOTE — Anesthesia Preprocedure Evaluation (Addendum)
Anesthesia Evaluation  Patient identified by MRN, date of birth, ID band Patient awake    Reviewed: Allergy & Precautions, H&P , NPO status , Patient's Chart, lab work & pertinent test results  History of Anesthesia Complications (+) PROLONGED EMERGENCE  Airway Mallampati: I TM Distance: >3 FB Neck ROM: full    Dental  (+) Teeth Intact and Dental Advisory Given   Pulmonary pneumonia -,          Cardiovascular negative cardio ROS  Rhythm:regular Rate:Normal     Neuro/Psych negative neurological ROS  negative psych ROS   GI/Hepatic negative GI ROS, Neg liver ROS,   Endo/Other  Morbid obesity  Renal/GU Renal disease (kidney stones)     Musculoskeletal   Abdominal (+) + obese,   Peds negative pediatric ROS (+)  Hematology negative hematology ROS (+)   Anesthesia Other Findings   Reproductive/Obstetrics negative OB ROS                        Anesthesia Physical Anesthesia Plan  ASA: II  Anesthesia Plan: General   Post-op Pain Management:    Induction: Intravenous  Airway Management Planned: Oral ETT and LMA  Additional Equipment:   Intra-op Plan:   Post-operative Plan: Extubation in OR  Informed Consent: I have reviewed the patients History and Physical, chart, labs and discussed the procedure including the risks, benefits and alternatives for the proposed anesthesia with the patient or authorized representative who has indicated his/her understanding and acceptance.     Plan Discussed with: CRNA, Anesthesiologist and Surgeon  Anesthesia Plan Comments:         Anesthesia Quick Evaluation

## 2012-05-06 NOTE — Transfer of Care (Signed)
Immediate Anesthesia Transfer of Care Note  Patient: Christopher Berry  Procedure(s) Performed: Procedure(s): OPEN REDUCTION INTERNAL FIXATION (ORIF) Right DISTAL RADIUS FRACTURE (Right)  Patient Location: PACU  Anesthesia Type:General  Level of Consciousness: awake, alert  and patient cooperative  Airway & Oxygen Therapy: Patient Spontanous Breathing and Patient connected to nasal cannula oxygen  Post-op Assessment: Report given to PACU RN, Post -op Vital signs reviewed and stable and Patient moving all extremities X 4  Post vital signs: Reviewed and stable  Complications: No apparent anesthesia complications

## 2012-05-06 NOTE — Progress Notes (Signed)
Patient admitted from PACU. Patient's right hand to elbow is in splint and brown wrap, DD&I. Patient able to wiggle fingers and feel fingers on right hand being touched. Patient's right arm is elevated on pillows and ice pack in place. Patient A&Ox3. Patient fell at work last week. Patient placed on bed alarm and told to call for assistance. Patient stated understanding. Patient's girlfriend at bedside. Will continue to monitor patient. Nelda Marseille, RN

## 2012-05-06 NOTE — H&P (Signed)
Christopher Berry is an 47 Berry.o. male.   Chief Complaint:   Right wrist pain with known fracture HPI:   [name] right-handed male who sustained an accidental fall at work last week on an outstretched right hand/wrist.  He was found to have a right distal radius fracture with intra-articular extension.  The alignment is near neutral, however, given the unstable nature of fractures like this, surgery is a good option.  Also this is his dominant wrist and he hopes to return to work activities much sooner.  We did try a short course of splinting which he did not tolerate well.  He does wish to proceed with surgery to stabilize his fracture.  He understands fully the risks of nerve and vessel injury, infection, and post-traumatic arthritis.  The goals are improved wrist function.  Past Medical History  Diagnosis Date  . Kidney stones   . Complication of anesthesia     Took a long time to wake up  . Pneumonia     as a child    Past Surgical History  Procedure Laterality Date  . Cholecystectomy      in his 58s    Family History  Problem Relation Age of Onset  . Diabetes Mother   . Diabetes Father   . Diabetes Brother    Social History:  reports that he has never smoked. He does not have any smokeless tobacco history on file. He reports that he does not drink alcohol or use illicit drugs.  Allergies:  Allergies  Allergen Reactions  . Codeine     Rage; on edge    No prescriptions prior to admission    No results found for this or any previous visit (from the past 48 hour(s)). No results found.  Review of Systems  All other systems reviewed and are negative.    There were no vitals taken for this visit. Physical Exam  Constitutional: He is oriented to person, place, and time. He appears well-developed and well-nourished.  HENT:  Head: Normocephalic and atraumatic.  Eyes: EOM are normal. Pupils are equal, round, and reactive to light.  Neck: Normal range of motion. Neck supple.   Cardiovascular: Normal rate and regular rhythm.   Respiratory: Effort normal and breath sounds normal.  GI: Soft. Bowel sounds are normal.  Musculoskeletal:       Right wrist: He exhibits decreased range of motion, bony tenderness, swelling and deformity.  Neurological: He is alert and oriented to person, place, and time.  Skin: Skin is warm and dry.  Psychiatric: He has a normal mood and affect.     Assessment/Plan Right dominant wrist distal radius fracture with intra-articular extension 1) To the OR today for open reduction/internal fixation of this unstable fracture.  We will then place him in overnight observation for antibiotics and pain control  Christopher Berry 05/06/2012, 12:21 PM

## 2012-05-06 NOTE — Anesthesia Procedure Notes (Signed)
Procedure Name: Intubation Date/Time: 05/06/2012 3:56 PM Performed by: Leona Singleton A Pre-anesthesia Checklist: Patient identified Patient Re-evaluated:Patient Re-evaluated prior to inductionOxygen Delivery Method: Circle system utilized Preoxygenation: Pre-oxygenation with 100% oxygen Intubation Type: IV induction Ventilation: Two handed mask ventilation required and Oral airway inserted - appropriate to patient size Laryngoscope Size: Hyacinth Meeker and 2 Grade View: Grade II Tube type: Oral Tube size: 7.5 mm Number of attempts: 1 Airway Equipment and Method: Stylet and LTA kit utilized Placement Confirmation: ETT inserted through vocal cords under direct vision,  positive ETCO2 and breath sounds checked- equal and bilateral Secured at: 22 cm Tube secured with: Tape Dental Injury: Teeth and Oropharynx as per pre-operative assessment

## 2012-05-06 NOTE — Preoperative (Signed)
Beta Blockers   Reason not to administer Beta Blockers:Not Applicable 

## 2012-05-07 ENCOUNTER — Encounter (HOSPITAL_COMMUNITY): Payer: Self-pay | Admitting: Orthopaedic Surgery

## 2012-05-07 MED ORDER — OXYCODONE HCL 5 MG PO TABS: 5.0000 mg | ORAL_TABLET | ORAL | Status: AC | PRN

## 2012-05-07 NOTE — Care Management Note (Signed)
    Page 1 of 1   05/07/2012     12:01:40 PM   CARE MANAGEMENT NOTE 05/07/2012  Patient:  Christopher Berry, Christopher Berry   Account Number:  [id number]  Date Initiated:  05/07/2012  Documentation initiated by:  Letha Cape  Subjective/Objective Assessment:   dx distal radius fx  admit as observation-lives with family.     Action/Plan:   Anticipated DC Date:  05/07/2012   Anticipated DC Plan:  HOME/SELF CARE      DC Planning Services  CM consult      Choice offered to / List presented to:             Status of service:  Completed, signed off Medicare Important Message given?   (If response is "NO", the following Medicare IM given date fields will be blank) Date Medicare IM given:   Date Additional Medicare IM given:    Discharge Disposition:  HOME/SELF CARE  Per UR Regulation:  Reviewed for med. necessity/level of care/duration of stay  If discussed at Long Length of Stay Meetings, dates discussed:    Comments:  05/07/12 9:01 Letha Cape RN, BSN [phone] patient lives with family, pta indep. Patient dc to home no needs identified.

## 2012-05-07 NOTE — Progress Notes (Signed)
Subjective: 1 Day Post-Op Procedure(s) (LRB): OPEN REDUCTION INTERNAL FIXATION (ORIF) Right DISTAL RADIUS FRACTURE (Right) Patient reports pain as moderate.   No Complaints otherwise. Objective: Vital signs in last 24 hours: Temp:  [97 F (36.1 C)-98.6 F (37 C)] 97.5 F (36.4 C) (03/05 0542) Pulse Rate:  [61-94] 72 (03/05 0542) Resp:  [6-20] 18 (03/05 0542) BP: (132-162)/(69-99) 132/82 mmHg (03/05 0542) SpO2:  [93 %-100 %] 98 % (03/05 0542) Weight:  [120.4 kg (265 lb 6.9 oz)-124.4 kg (274 lb 4 oz)] 120.4 kg (265 lb 6.9 oz) (03/04 2117)  Intake/Output from previous day: 03/04 0701 - 03/05 0700 In: 1893.8 [P.O.:180; I.V.:1663.8; IV Piggyback:50] Out: 600 [Urine:600] Intake/Output this shift:     Recent Labs  05/06/12 1317  HGB 14.2    Recent Labs  05/06/12 1317  WBC 14.0*  RBC 4.53  HCT 40.8  PLT 366   No results found for this basename: NA, K, CL, CO2, BUN, CREATININE, GLUCOSE, CALCIUM,  in the last 72 hours No results found for this basename: LABPT, INR,  in the last 72 hours  Right wrist: Neurologically intact Incision: no drainage Compartment soft  Assessment/Plan: 1 Day Post-Op Procedure(s) (LRB): OPEN REDUCTION INTERNAL FIXATION (ORIF) Right DISTAL RADIUS FRACTURE (Right) Discharge home  Follow-up in 2 weeks call office for appointment Christopher Berry 05/07/2012, 7:59 AM

## 2012-05-07 NOTE — Progress Notes (Signed)
Patient discharge teaching given, including activity, diet, follow-up appoints, and medications. Patient verbalized understanding of all discharge instructions. IV access was d/c'd. Vitals are stable. Skin is intact except as charted in most recent assessments. Pt to be escorted out by NT, to be driven home by family. 

## 2012-05-07 NOTE — Op Note (Signed)
[name], [name]:  [id number]  MEDICAL RECORD NO.:  [id number]  LOCATION:  5506                         FACILITY:  [facility]  PHYSICIAN:  Vanita Panda. Magnus Ivan, M.D.DATE OF BIRTH:  46-02-1966  DATE OF PROCEDURE:  05/06/2012 DATE OF DISCHARGE:                              OPERATIVE REPORT   PREOPERATIVE DIAGNOSIS:  Right intra-articular unstable distal radius fracture.  POSTOPERATIVE DIAGNOSIS:  Right intra-articular unstable distal radius fracture.  PROCEDURE:  Open reduction and internal fixation of right distal radius fracture.  IMPLANTS:  [employer] distal volar radius (DVR) locking plate.  SURGEON:  Vanita Panda. Magnus Ivan, MD  ASSISTANT:  Kriste Basque PA-C.  ANESTHESIA: 1. General. 2. Local cortisone and plain Sensorcaine.  BLOOD LOSS:  Minimal.  ANTIBIOTICS:  2 g IV Ancef.  TOURNIQUET TIME:  Less than 1 hour.  COMPLICATIONS:  None.  INDICATIONS:  Christopher Berry is right-hand dominant 47 year old who fell on the cab of his truck last week on to outstretched right dominant wrist. He was seen at urgent care and we saw him in the office and found that he had an intra-articular distal radius fracture.  There was no significant displacement of the fracture, but there was an intra- articular component to this and this is a dominant wrist.  We then saw him back in the office, after he was having a hard time with splinting and he was starting to have some shortening and dorsal malalignment. Given the fact, this is a dominant wrist and he is so anxious to get back to working, he does wish to proceed with the open reduction and internal fixation with volar plating to be able to get him back out of length and stabilization and also be able to get him moving quicker and healing.  He understands the risks and benefits of surgery in detail and he does wish to proceed.  PROCEDURE DESCRIPTION:  After informed consent was obtained,  appropriate left wrist was marked.  He was brought to the operating room, placed supine on the operating table.  General anesthesia was then obtained.  A nonsterile tourniquet was placed around his upper right arm and his left hand.  Wrist and forearm were all prepped and draped with DuraPrep and sterile drapes.  Time-out was called, and he was identified as the correct patient, correct right wrist.  We then used an Esmarch to wrap out the wrist and tourniquet was inflated to 250 mmHg of pressure.  We then took a standard volar approach to the wrist and dissected down between the interval of the radial artery and the flexor carpi radialis tendon.  I dissected down to the pronator quadratus and then it was divided from radial to ulnar direction.  We then found the fracture and cleaned the fracture debris.  I used a Engineer, site and did tease this back in reduced position.  He did have more shortening and dorsal displacement. Once we got into a closed position, was closely reduced as possible, we assessed under fluoroscopic guidance.  I then chose a [employer] DVR plate, standard width short plate and placed along the volar cortex. We secured this with bicortical screws proximally and  locking pegs distally.  Once we had done this, again we confirmed the reduction and placed under direct fluoroscopy.  I put the wrist to flexion, extension, pronation, and supination and did not find any blocks to this knee grinding.  The fracture did not displace.  We then copiously irrigated the soft tissues.  We irrigated with normal saline solution.  I closed the deep tissue with 2-0 Vicryl followed by 4-0 Vicryl subcutaneous tissue, interrupted 3-0 nylon skin.  I infiltrated the incision with 0.25% plain Sensorcaine and placed Xeroform and well-padded sterile dressing, and a temporary plaster volar splint for overnight.  We are going to admit him overnight for pain control, IV antibiotics, and nausea  control as well.  Tomorrow, he will be discharged with a removable wrist splint, so we can get his wrist moving.  Of note, Kriste Basque, PA-C was present and assisted in the entire case and his assistance was crucial mainly with exposure, the reduction of the fracture and help in hold or reduce so we could get the plate on and then the closure of the wound.     Vanita Panda. Magnus Ivan, M.D.     CYB/MEDQ  D:  05/06/2012  T:  05/07/2012  Job:  [id number]

## 2012-05-08 LAB — MRSA CULTURE

## 2012-05-08 NOTE — Discharge Summary (Signed)
Physician Discharge Summary  Patient ID: Christopher Berry MRN: [id number] DOB/AGE: 46/46/1967 46 y.o.  Admit date: 05/06/2012 Discharge date: 05/08/2012  Admission Diagnoses: Right Distal radius fracture  Discharge Diagnoses:  Principal Problem:   Distal radius fracture, right   Discharged Condition: Stable improved  Hospital Course: Uncomplicated   Consults:None  Significant Diagnostic Studies: none  Treatments: ORIF right distal radius fracture  Discharge Exam: Blood pressure 132/82, pulse 72, temperature 97.5 F (36.4 C), temperature source Oral, resp. rate 18, height 6' (1.829 m), weight 120.4 kg (265 lb 6.9 oz), SpO2 98.00%.  Physical exam:  General: Awake alert and no acute distress Right wrist: surgical wound benign , well approximated with interrupted sutures . Hand NVI .   Disposition: 01-Home or Self Care     Medication List    STOP taking these medications       oxyCODONE-acetaminophen 5-325 MG per tablet  Commonly known as:  PERCOCET/ROXICET      TAKE these medications       diphenhydrAMINE 25 mg capsule  Commonly known as:  BENADRYL  Take 25 mg by mouth every 6 (six) hours as needed for itching.     oxyCODONE 5 MG immediate release tablet  Commonly known as:  Oxy IR/ROXICODONE  Take 1-2 tablets (5-10 mg total) by mouth every 3 (three) hours as needed.     predniSONE 50 MG tablet  Commonly known as:  DELTASONE  1 tab daily x 5 days           Follow-up Information   Follow up with Kathryne Hitch, MD In 2 weeks.   Contact information:   [address] [place] [address] [phone]       Signed: Richardean Canal 05/08/2012, 8:33 PM

## 2012-05-09 ENCOUNTER — Telehealth: Payer: Self-pay | Admitting: Family Medicine

## 2012-05-09 NOTE — Telephone Encounter (Signed)
Call pt: screened positive for sleep apnea during her pre-surgical visit. Next step would be to get a sleep study.  WE can arrange for this if he would like, when he would like.  Please let me know if pt is ok with moving forward with getting tested.

## 2012-05-09 NOTE — Discharge Summary (Signed)
  Patient ID: Christopher Berry MRN: [id number] DOB/AGE: November 07, 1965 47 y.o.  Admit date: 05/06/2012 Discharge date: 05/09/2012  Admission Diagnoses:  Principal Problem:   Distal radius fracture, right   Discharge Diagnoses:  Same  Past Medical History  Diagnosis Date  . Kidney stones   . Complication of anesthesia     Took a long time to wake up  . Pneumonia     as a child    Surgeries: Procedure(s): OPEN REDUCTION INTERNAL FIXATION (ORIF) Right DISTAL RADIUS FRACTURE on 05/06/2012   Consultants:    Discharged Condition: Improved  Hospital Course: Christopher Berry is an 47 y.o. male who was admitted 05/06/2012 for operative treatment ofDistal radius fracture, right. Patient has severe unremitting pain that affects sleep, daily activities, and work/hobbies. After pre-op clearance the patient was taken to the operating room on 05/06/2012 and underwent  Procedure(s): OPEN REDUCTION INTERNAL FIXATION (ORIF) Right DISTAL RADIUS FRACTURE.    Patient was given perioperative antibiotics: Anti-infectives   Start     Dose/Rate Route Frequency Ordered Stop   05/06/12 2200  ceFAZolin (ANCEF) IVPB 1 g/50 mL premix  Status:  Discontinued     1 g 100 mL/hr over 30 Minutes Intravenous Every 6 hours 05/06/12 2109 05/07/12 1442   05/06/12 0600  ceFAZolin (ANCEF) 3 g in dextrose 5 % 50 mL IVPB     3 g 160 mL/hr over 30 Minutes Intravenous On call to O.R. 05/05/12 1432 05/06/12 1559       Patient was given sequential compression devices, early ambulation, and chemoprophylaxis to prevent DVT.  Patient benefited maximally from hospital stay and there were no complications.    Recent vital signs: No data found.    Recent laboratory studies:  Recent Labs  05/06/12 1317  WBC 14.0*  HGB 14.2  HCT 40.8  PLT 366     Discharge Medications:     Medication List    STOP taking these medications       oxyCODONE-acetaminophen 5-325 MG per tablet  Commonly known as:  PERCOCET/ROXICET      TAKE these  medications       diphenhydrAMINE 25 mg capsule  Commonly known as:  BENADRYL  Take 25 mg by mouth every 6 (six) hours as needed for itching.     oxyCODONE 5 MG immediate release tablet  Commonly known as:  Oxy IR/ROXICODONE  Take 1-2 tablets (5-10 mg total) by mouth every 3 (three) hours as needed.     predniSONE 50 MG tablet  Commonly known as:  DELTASONE  1 tab daily x 5 days        Diagnostic Studies: Dg Neck Soft Tissue  05/01/2012  *RADIOLOGY REPORT*  Clinical Data: Pharyngitis. Evaluate for possible epiglottitis.  NECK SOFT TISSUES - 1+ VIEW  Comparison:  None.  Findings:  There is no evidence of retropharyngeal soft tissue swelling or epiglottic enlargement.  The cervical airway is unremarkable and no radio-opaque foreign body identified.  IMPRESSION: Negative.   Original Report Authenticated By: Davonna Belling, M.D.     Disposition: 01-Home or Self Care        Follow-up Information   Follow up with Kathryne Hitch, MD In 2 weeks.   Contact information:   [address] [place] [address] [phone]        Signed: Kathryne Hitch 05/09/2012, 6:22 AM

## 2012-05-12 NOTE — Telephone Encounter (Signed)
Left message for patient to return call.

## 2012-05-19 NOTE — Telephone Encounter (Signed)
Please call him one more time. Thank you.

## 2012-05-20 NOTE — Telephone Encounter (Signed)
Pt states that he was under the impression that a sleep study is what he just had done. Please clarify .

## 2012-05-20 NOTE — Telephone Encounter (Signed)
LMOM for pt to call back.

## 2012-05-20 NOTE — Telephone Encounter (Signed)
Pt states that he had the study done through [facility].  Will call & get their notes.

## 2012-05-20 NOTE — Telephone Encounter (Signed)
OK, sounds good. Lets find out where had the study adn see if we can have a copy for our records.

## 2012-06-19 DIAGNOSIS — G4733 Obstructive sleep apnea (adult) (pediatric): Secondary | ICD-10-CM | POA: Insufficient documentation

## 2012-08-12 IMAGING — CR DG KNEE COMPLETE 4+V*L*
4 series · 4 of 4 positions shown · non-contrast
Comparison: None.

CLINICAL DATA: Injured playing basketball with knee pain and
swelling

LEFT KNEE - COMPLETE 4+ VIEW

[view not recorded (1 of 4)]
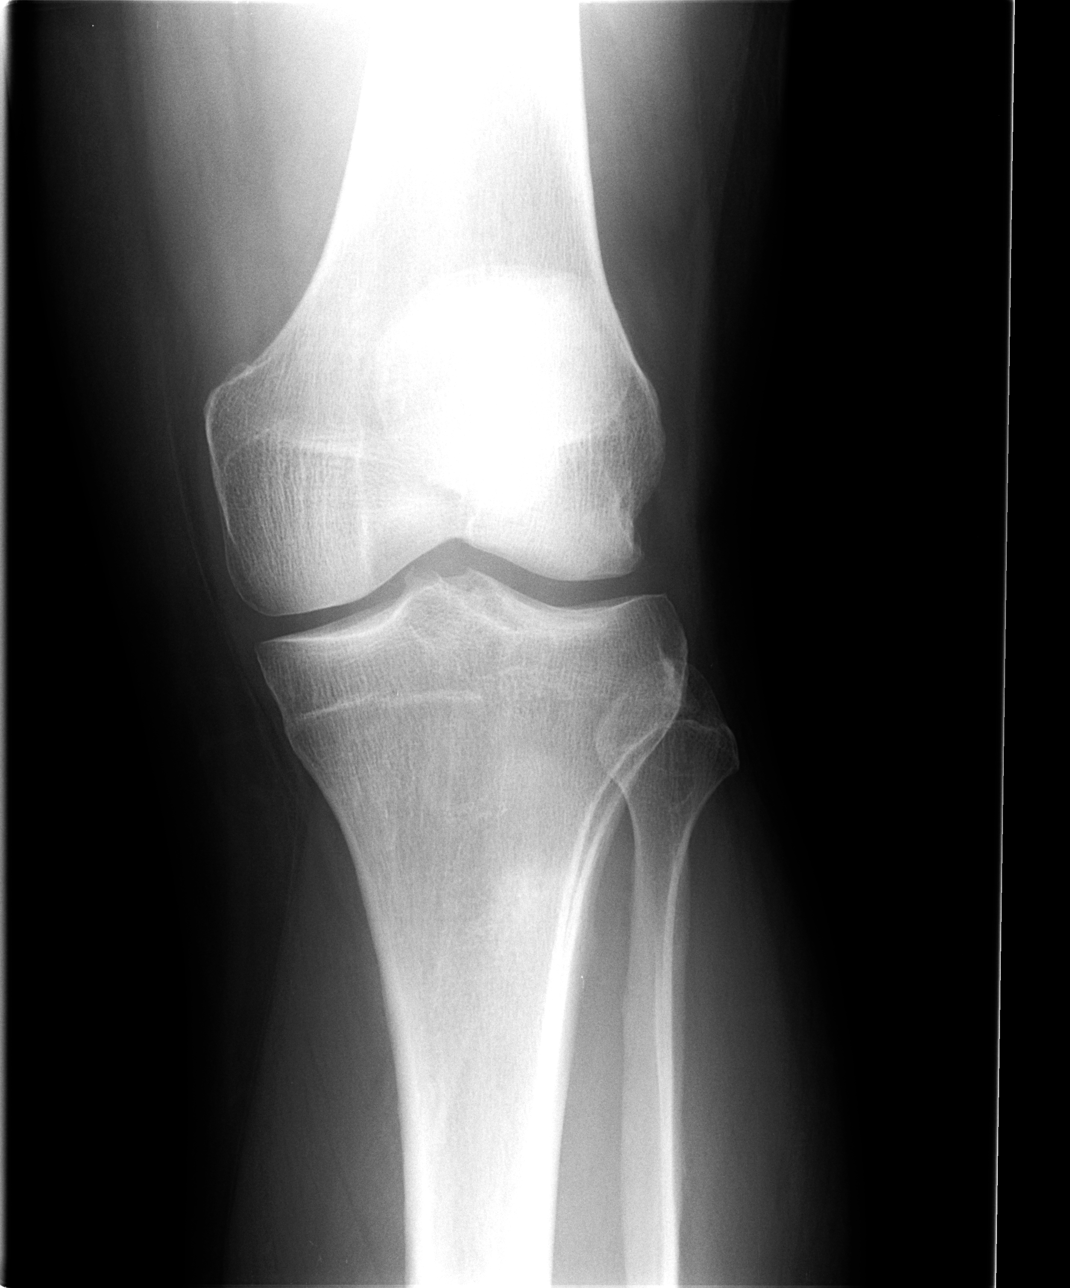

[view not recorded (2 of 4)]
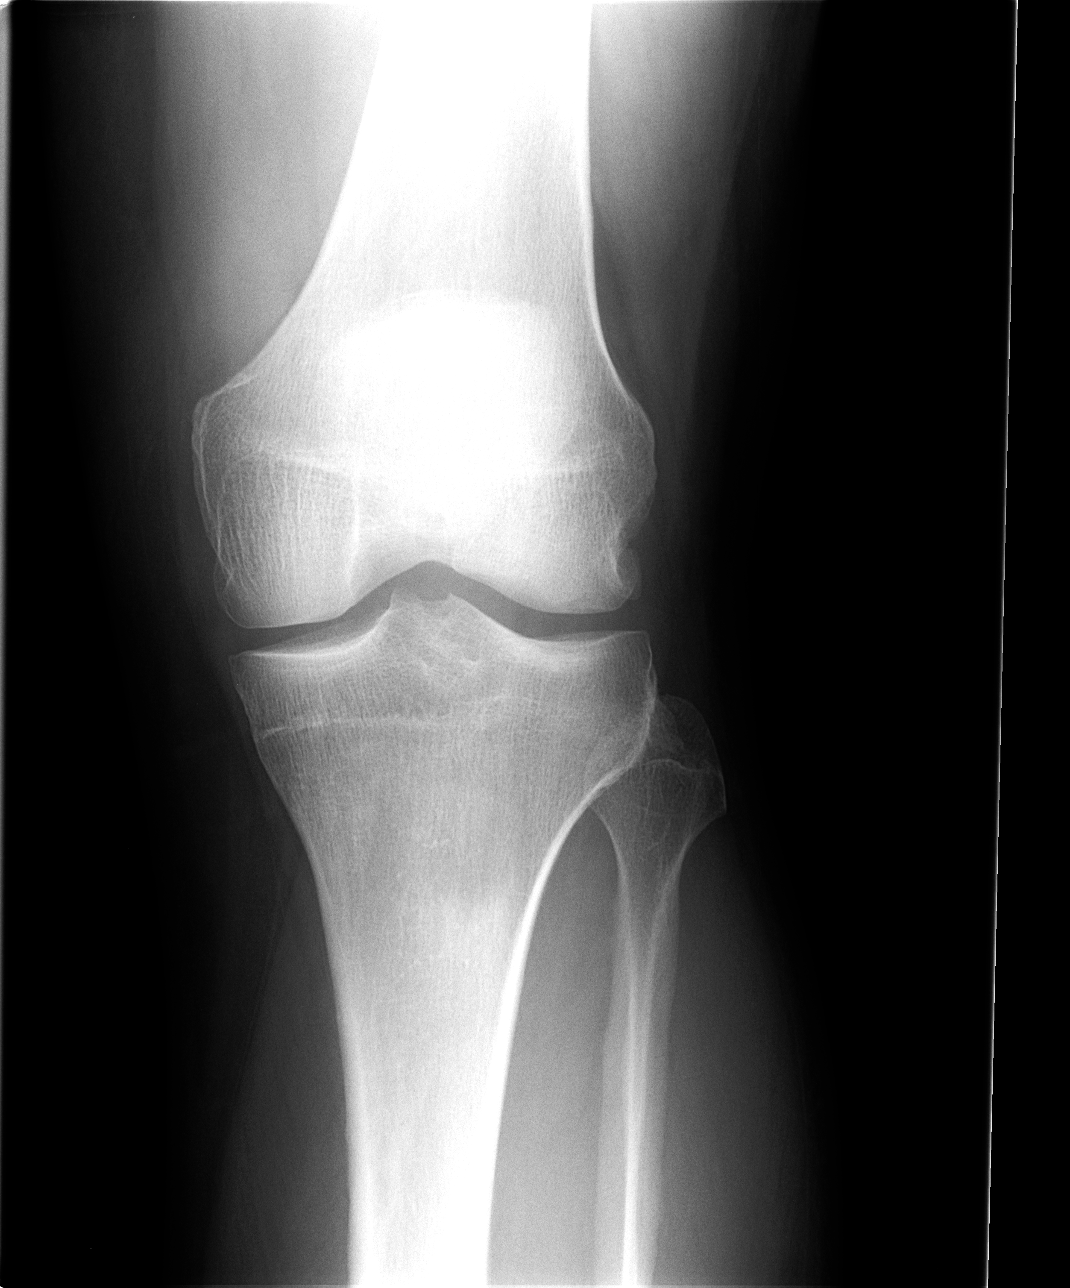

[view not recorded (3 of 4)]
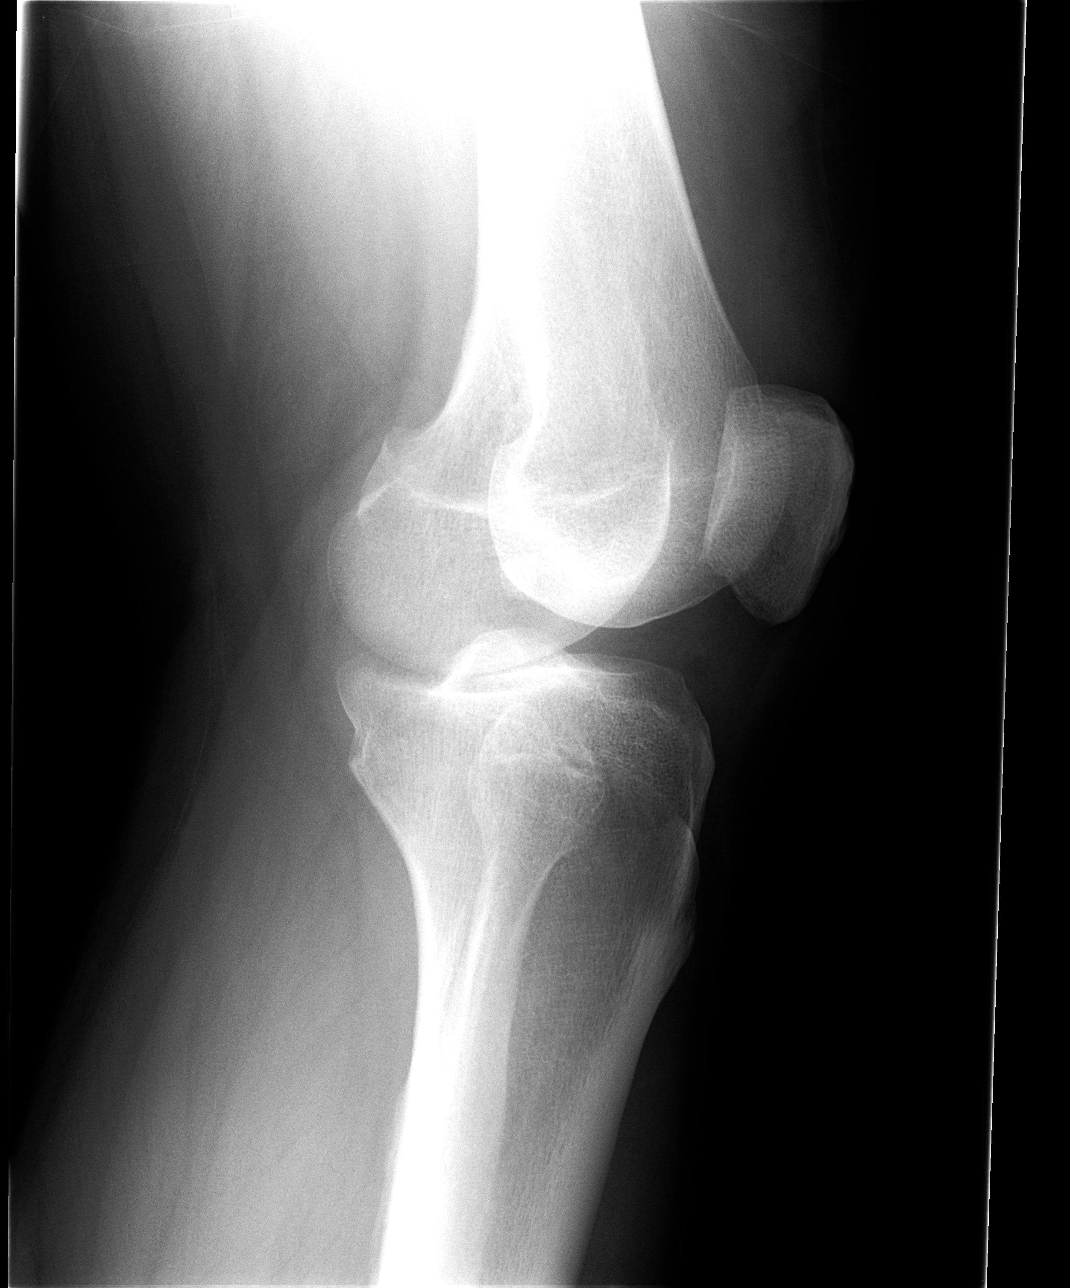

[view not recorded (4 of 4)]
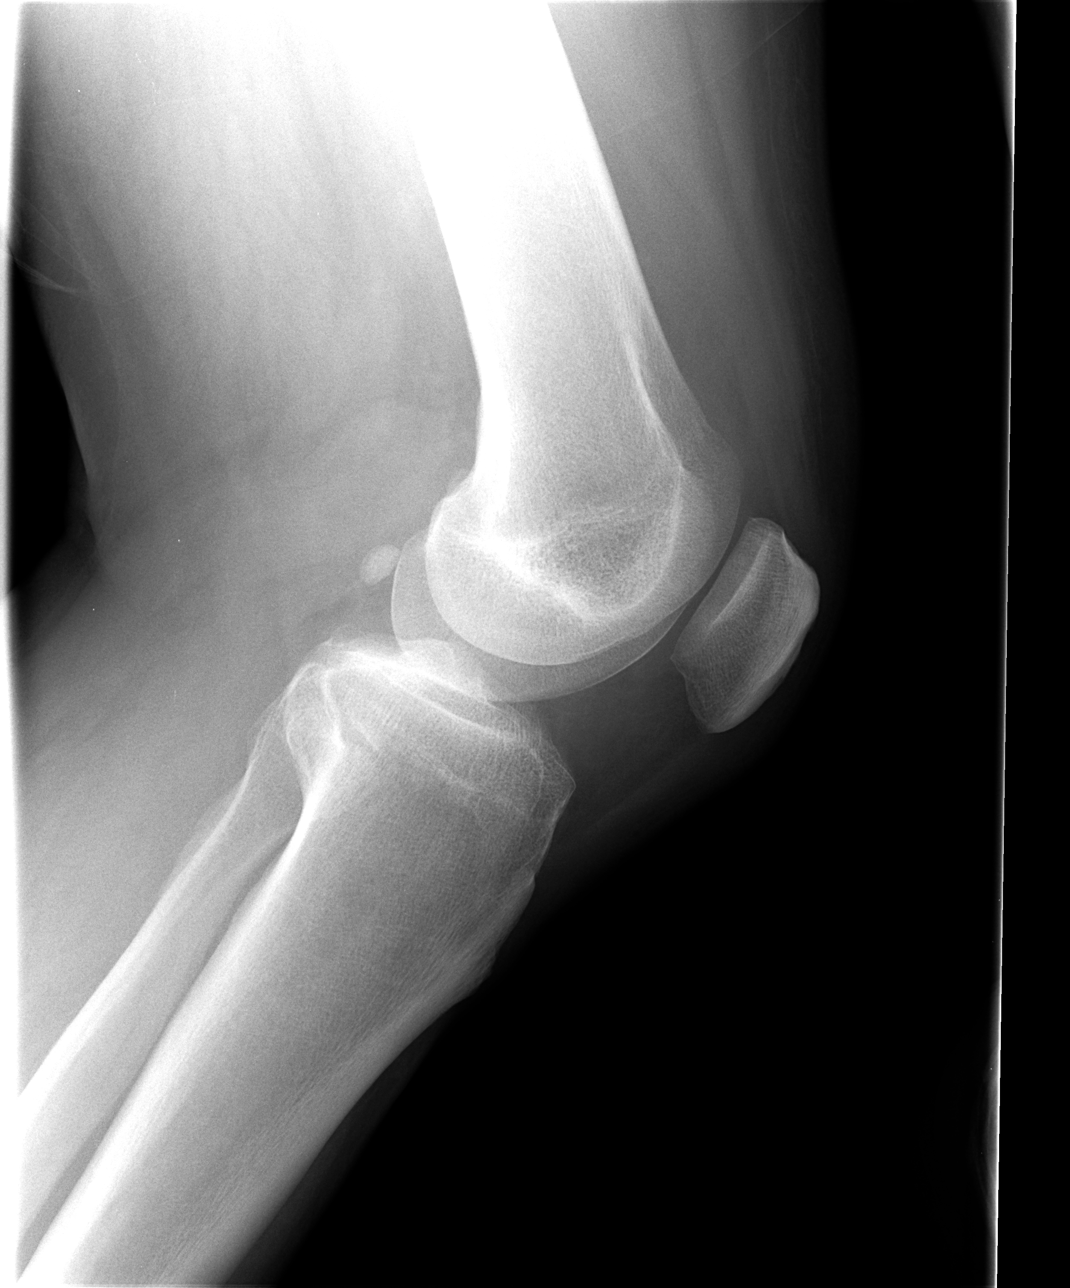

[4 of 4 positions shown; findings below may reference images not displayed]

FINDINGS: There is only slight loss of medial joint space.  No
fracture is seen.  No definite effusion is noted .
IMPRESSION: No acute abnormality.  Slight loss of medial joint space.

## 2013-06-20 ENCOUNTER — Encounter: Payer: Self-pay | Admitting: Emergency Medicine

## 2013-06-20 ENCOUNTER — Emergency Department
Admission: EM | Admit: 2013-06-20 | Discharge: 2013-06-20 | Disposition: A | Discharge status: Home / Self Care | Payer: BC Managed Care – PPO | Source: Home / Self Care | Attending: Family Medicine | Admitting: Family Medicine

## 2013-06-20 DIAGNOSIS — H6980 Other specified disorders of Eustachian tube, unspecified ear: Secondary | ICD-10-CM

## 2013-06-20 HISTORY — DX: Sleep apnea, unspecified: G47.30

## 2013-06-20 MED ORDER — PREDNISONE 20 MG PO TABS: 20.0000 mg | ORAL_TABLET | Freq: Two times a day (BID) | ORAL | Status: DC

## 2013-06-20 MED ORDER — AMOXICILLIN 875 MG PO TABS: 875.0000 mg | ORAL_TABLET | Freq: Two times a day (BID) | ORAL | Status: DC

## 2013-06-20 NOTE — ED Notes (Signed)
[place] and said it felt like his ears needed to pop. A few days later, pt experienced hearing loss in R ear then was sensitive to sound and went back to hearing loss.  R ear only. Pt also experienced dizziness and feels liek there is fluid moving around in the ear.  Has also had headaches.  Pain in ear is 3/10

## 2013-06-20 NOTE — ED Provider Notes (Signed)
CSN: [id number]     Arrival date & time 06/20/13  0914 History   First MD Initiated Contact with Patient 06/20/13 [phone]     Chief Complaint  Patient presents with  . Hearing Loss    R ear      HPI Comments: Patient [place] the [place] about 10 [place] ago and he felt mild pressure in his ears.  He was unable to equalize the pressure with change in altitude. A few days later, he experienced hearing loss and sensitivity to sound in his right ear.  He has had mild intermittent dizziness and feels like there is fluid moving around in the right ear.  Has also had headaches.  Pain in ear is 3/10  The history is provided by the patient.    Past Medical History  Diagnosis Date  . Kidney stones   . Complication of anesthesia     Took a long time to wake up  . Pneumonia     as a child  . Sleep apnea     sleeps with cpap   Past Surgical History  Procedure Laterality Date  . Cholecystectomy      in his 4920s  . Open reduction internal fixation (orif) distal radial fracture Right 05/06/2012    Procedure: OPEN REDUCTION INTERNAL FIXATION (ORIF) Right DISTAL RADIUS FRACTURE;  Surgeon: Kathryne Hitchhristopher Y [name], MD;  Location: [facility] OR;  Service: Orthopedics;  Laterality: Right;   Family History  Problem Relation Age of Onset  . Diabetes Mother   . Diabetes Father   . Diabetes Brother    History  Substance Use Topics  . Smoking status: Never Smoker   . Smokeless tobacco: Not on file  . Alcohol Use: No    Review of Systems No sore throat No cough No pleuritic pain No wheezing + nasal congestion + dizzy ? post-nasal drainage No sinus pain/pressure No itchy/red eyes ? Earache right ear No hemoptysis No SOB No fever/chills No nausea No vomiting No abdominal pain No diarrhea No urinary symptoms No skin rash No fatigue No myalgias No headache Used OTC meds without relief  Allergies  Codeine  Home Medications   Prior to Admission medications   Medication Sig Start Date End  Date Taking? Authorizing Provider  cetirizine (ZYRTEC) 5 MG tablet Take 5 mg by mouth daily.   Yes Historical Provider, MD  diphenhydrAMINE (BENADRYL) 25 mg capsule Take 25 mg by mouth every 6 (six) hours as needed for itching.    Historical Provider, MD  oxyCODONE (OXY IR/ROXICODONE) 5 MG immediate release tablet Take 1-2 tablets (5-10 mg total) by mouth every 3 (three) hours as needed. 05/07/12   Richardean CanalGilbert Clark, PA-C  predniSONE (DELTASONE) 50 MG tablet 1 tab daily x 5 days 05/01/12   Doree AlbeeSteven Newton, MD   BP 146/87  Pulse 95  Temp(Src) 98.2 F (36.8 C) (Oral)  Ht 6' (1.829 m)  Wt 310 lb (140.615 kg)  BMI 42.03 kg/m2  SpO2 97% Physical Exam Nursing notes and Vital Signs reviewed. Appearance:  Patient appears stated age, and in no acute distress.  Patient is obese (BMI 42.0) Eyes:  Pupils are equal, round, and reactive to light and accomodation.  Extraocular movement is intact.  Conjunctivae are not inflamed.  No nystagmus  Ears:  Canals normal.  Tympanic membranes normal.  Nose:   Congested turbinates, increased on the right.  No sinus tenderness.   Pharynx:  Normal Neck:  Supple.  No adenopathy Lungs:  Clear to auscultation.  Breath  sounds are equal.  Heart:  Regular rate and rhythm without murmurs, rubs, or gallops.  Abdomen:  Nontender without masses or hepatosplenomegaly.  Bowel sounds are present.  No CVA or flank tenderness.  Extremities:  No edema.  No calf tenderness Skin:  No rash present.   ED Course  Procedures  none Labs Review Tympanogram:  Positive peak pressure left ear; normal right ear.        MDM   1. Eustachian tube dysfunction    Begin prednisone burst, and empiric amoxicillin Take Mucinex D (1200mg  guaifenesin with decongestant) twice daily for congestion.  Increase fluid intake, rest. May use Afrin nasal spray (or generic oxymetazoline) twice daily for about 5 days.  Also recommend using saline nasal spray several times daily and saline nasal irrigation  (AYR is a common brand) Continue plain Zyrtec for allergy Followup with ENT if not improved about 10 days    Lattie HawStephen A Kimbery Harwood, MD 06/24/13 [phone]

## 2013-06-20 NOTE — Discharge Instructions (Signed)
Take Mucinex D (1200mg  guaifenesin with decongestant) twice daily for congestion.  Increase fluid intake, rest. May use Afrin nasal spray (or generic oxymetazoline) twice daily for about 5 days.  Also recommend using saline nasal spray several times daily and saline nasal irrigation (AYR is a common brand) Continue plain Zyrtec for allergy

## 2013-08-06 DIAGNOSIS — E291 Testicular hypofunction: Secondary | ICD-10-CM | POA: Insufficient documentation

## 2013-08-06 DIAGNOSIS — M549 Dorsalgia, unspecified: Secondary | ICD-10-CM | POA: Insufficient documentation

## 2014-08-26 DIAGNOSIS — H9190 Unspecified hearing loss, unspecified ear: Secondary | ICD-10-CM | POA: Insufficient documentation

## 2014-08-26 DIAGNOSIS — I10 Essential (primary) hypertension: Secondary | ICD-10-CM | POA: Insufficient documentation

## 2014-12-30 IMAGING — CR DG NECK SOFT TISSUE
2 series · 2 of 2 positions shown · non-contrast
Comparison: None.

CLINICAL DATA: Pharyngitis. Evaluate for possible epiglottitis.

NECK SOFT TISSUES - 1+ VIEW

[view not recorded (1 of 2)]
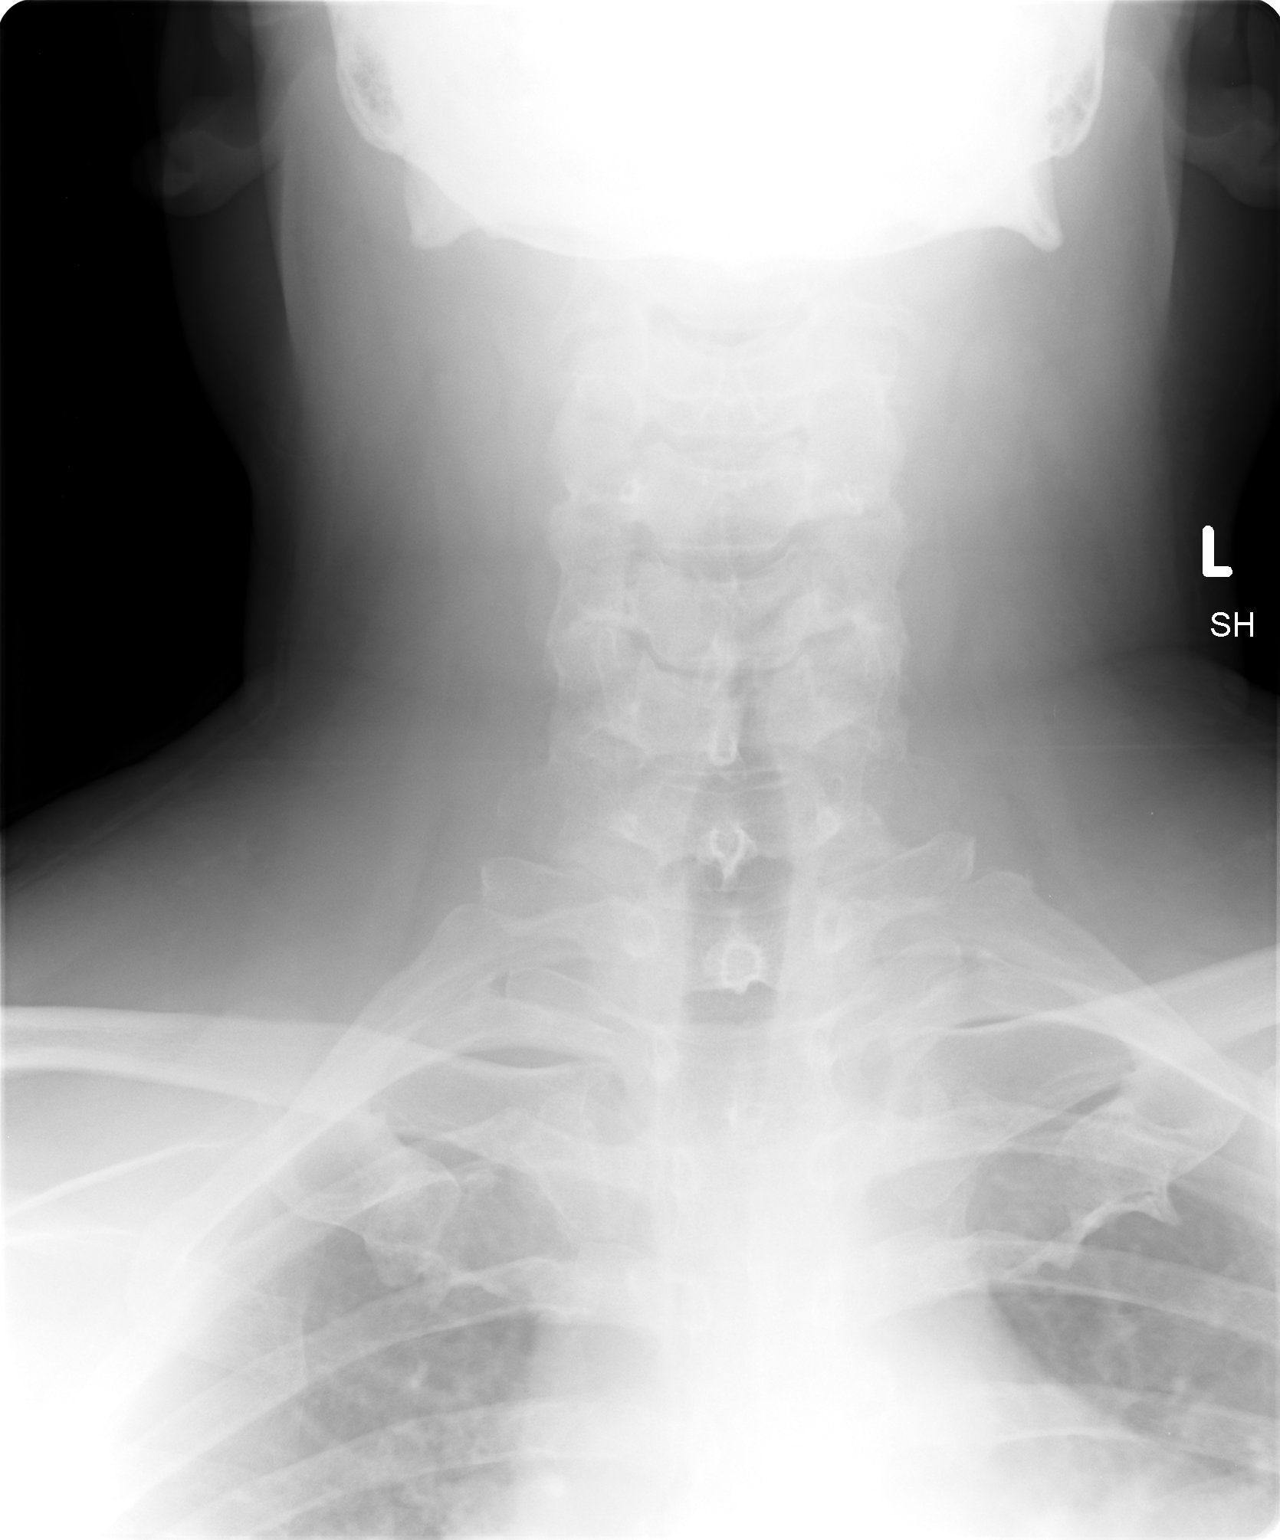

[view not recorded (2 of 2)]
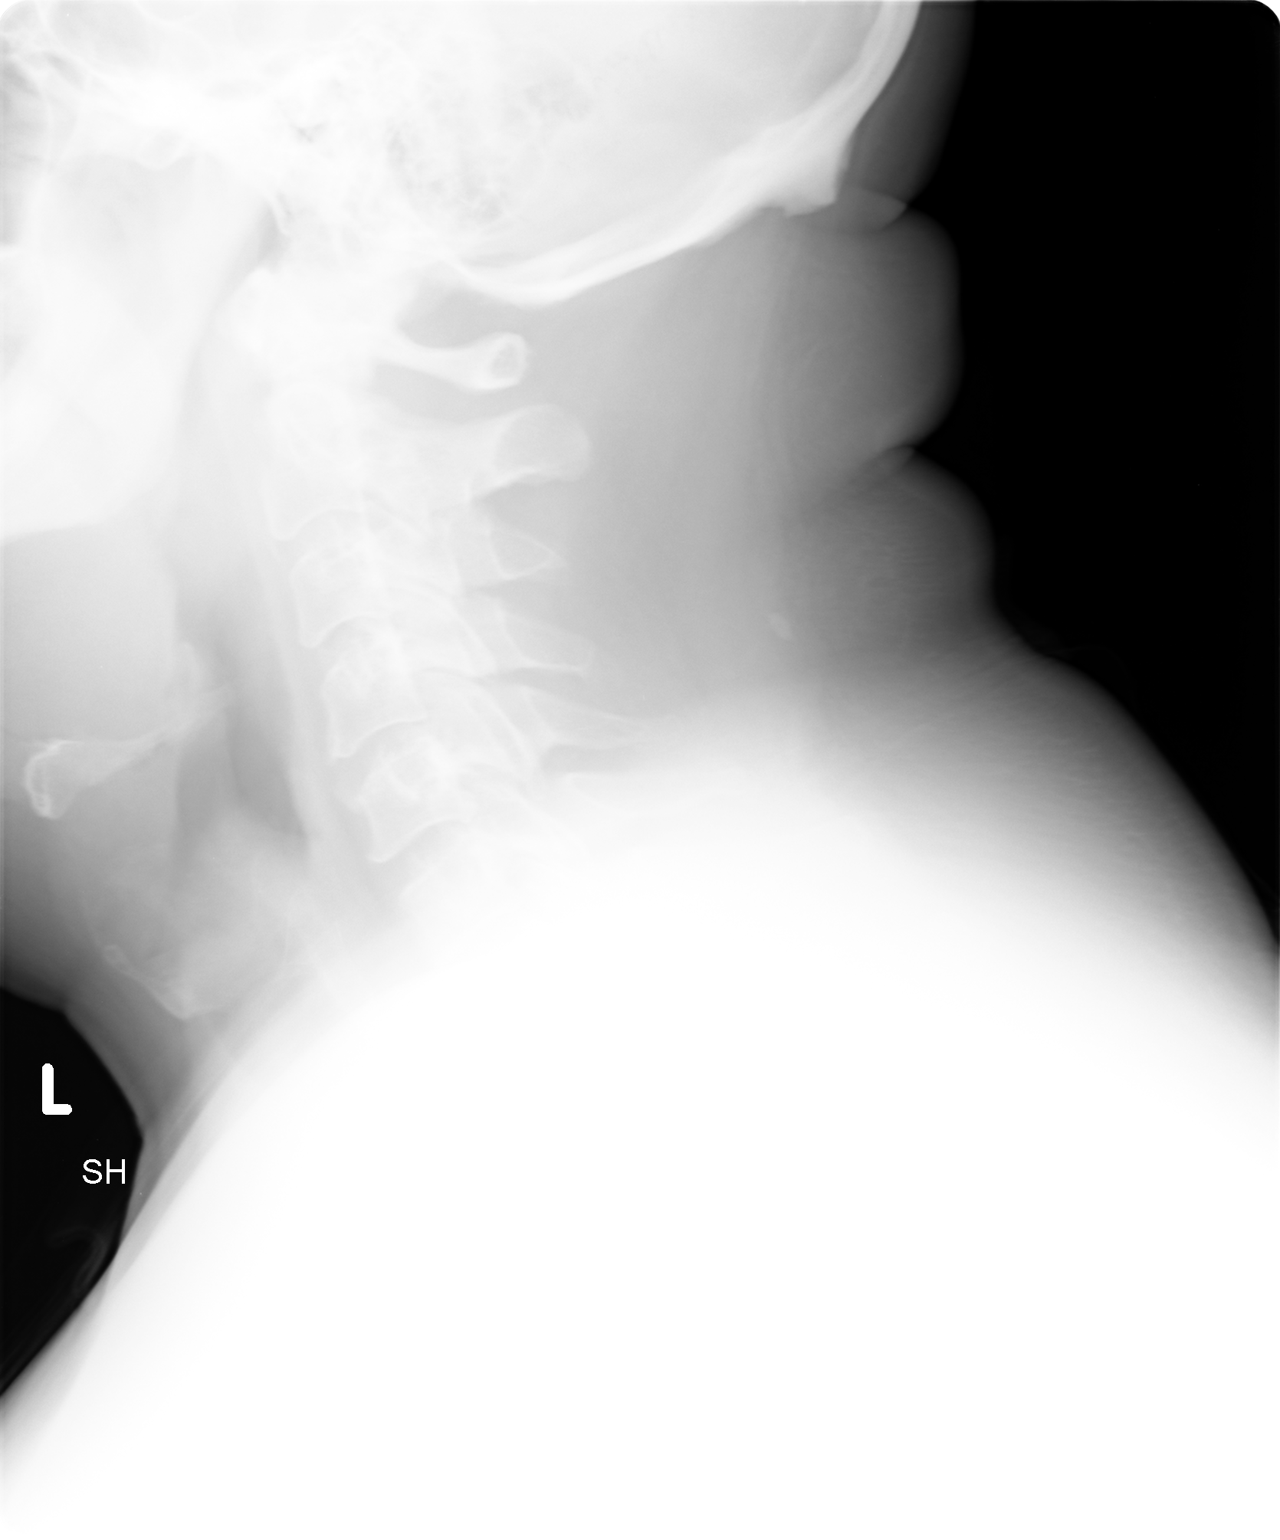

[2 of 2 positions shown; findings below may reference images not displayed]

FINDINGS: There is no evidence of retropharyngeal soft tissue
swelling or epiglottic enlargement.  The cervical airway is
unremarkable and no radio-opaque foreign body identified.
IMPRESSION: Negative.

## 2015-04-08 ENCOUNTER — Ambulatory Visit (INDEPENDENT_AMBULATORY_CARE_PROVIDER_SITE_OTHER): Payer: BLUE CROSS/BLUE SHIELD | Admitting: Licensed Clinical Social Worker

## 2015-04-08 DIAGNOSIS — F4323 Adjustment disorder with mixed anxiety and depressed mood: Secondary | ICD-10-CM | POA: Diagnosis not present
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
## 2015-04-28 ENCOUNTER — Ambulatory Visit (INDEPENDENT_AMBULATORY_CARE_PROVIDER_SITE_OTHER): Payer: BLUE CROSS/BLUE SHIELD | Admitting: Licensed Clinical Social Worker

## 2015-04-28 DIAGNOSIS — F4323 Adjustment disorder with mixed anxiety and depressed mood: Secondary | ICD-10-CM

## 2015-04-28 NOTE — Progress Notes (Signed)
   THERAPIST PROGRESS NOTE  Session Time: 11:00am-11:55am  Participation Level: Active  Behavioral Response: CasualAlertEuthymic  Type of Therapy: Individual Therapy  Treatment Goals addressed: Developed goals today  Interventions: Treatment planning  Suicidal/Homicidal: Vague SI-no intent or plan  Admitted "There are times I wish I wasn't here."  Denied HI  Therapist Interventions:  Collaborated with patient to develop his treatment plan.  Let patient know how therapist prefers to structure sessions. Gathered information about patient's current routine. Described interventions patient can expect while participating in therapy.   Summary:  Indicated he would like to work on developing a greater sense of purpose to his life and adopting an attitude of acceptance regarding his life circumstances.   Reported that he does not have much of a daily routine.  No set wake or bedtime.  Generally doesn't eat until after noon.  Admits to avoiding face to face interaction in general.  Indicated this is due to discomfort with his hearing loss and also anxiety.  This is a significant contrast to how things used to be.  He said, "I was a work-a-holic."   Indicated he is open to learning about the different interventions.   Plan: Will do some psycho-education about anxiety at next session.  Diagnosis: Adjustment Disorder with mixed anxiety and depression    Christopher Berry 04/28/2015

## 2015-05-19 ENCOUNTER — Ambulatory Visit (HOSPITAL_COMMUNITY): Payer: Self-pay | Admitting: Licensed Clinical Social Worker

## 2015-06-02 ENCOUNTER — Ambulatory Visit (HOSPITAL_COMMUNITY): Payer: Self-pay | Admitting: Licensed Clinical Social Worker

## 2015-06-13 ENCOUNTER — Ambulatory Visit (HOSPITAL_COMMUNITY): Payer: Self-pay | Admitting: Licensed Clinical Social Worker

## 2017-01-11 ENCOUNTER — Other Ambulatory Visit: Payer: Self-pay

## 2017-01-11 ENCOUNTER — Encounter: Payer: Self-pay | Admitting: Emergency Medicine

## 2017-01-11 ENCOUNTER — Emergency Department (INDEPENDENT_AMBULATORY_CARE_PROVIDER_SITE_OTHER)
Admission: EM | Admit: 2017-01-11 | Discharge: 2017-01-11 | Disposition: A | Discharge status: Home / Self Care | Payer: BLUE CROSS/BLUE SHIELD | Source: Home / Self Care | Attending: Family Medicine | Admitting: Family Medicine

## 2017-01-11 DIAGNOSIS — J069 Acute upper respiratory infection, unspecified: Secondary | ICD-10-CM

## 2017-01-11 DIAGNOSIS — B9789 Other viral agents as the cause of diseases classified elsewhere: Secondary | ICD-10-CM

## 2017-01-11 LAB — POCT RAPID STREP A (OFFICE): Rapid Strep A Screen: NEGATIVE

## 2017-01-11 MED ORDER — AMOXICILLIN 875 MG PO TABS: 875.0000 mg | ORAL_TABLET | Freq: Two times a day (BID) | ORAL | 0 refills | Status: DC

## 2017-01-11 MED ORDER — PREDNISONE 20 MG PO TABS: ORAL_TABLET | ORAL | 0 refills | Status: DC

## 2017-01-11 NOTE — ED Triage Notes (Signed)
Pt c/o sore throat x4 days. Denies fever.

## 2017-01-11 NOTE — ED Provider Notes (Signed)
[facility]    CSN: [id number] Arrival date & time: 01/11/17  1942     History   Chief Complaint Chief Complaint  Patient presents with  . Sore Throat    HPI [facility] is a 51 y.o. male.   Patient complains of onset of sore throat and increased sinus congestion four days ago.  He states that he has had a chronic mild cough for a month but has not felt ill.  He denies fevers, chills, and sweats.  He has OSA and wears CPAP at night.  He has a history of eustachian tube dysfunction.      Past Medical History:  Diagnosis Date  . Complication of anesthesia    Took a long time to wake up  . Kidney stones   . Pneumonia    as a child  . Sleep apnea    sleeps with cpap    Patient Active Problem List   Diagnosis Date Noted  . Adjustment disorder with mixed anxiety and depressed mood 04/08/2015  . Difficulty hearing 08/26/2014  . BP (high blood pressure) 08/26/2014  . Morbid (severe) obesity due to excess calories (HCC) 08/26/2014  . 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 Back ache 08/06/2013  . Obstructive apnea 06/19/2012  . Distal radius fracture, right 05/06/2012  . Closed fracture of distal end of radius 05/06/2012    Past Surgical History:  Procedure Laterality Date  . CHOLECYSTECTOMY     in his 1220s       Home Medications    Prior to Admission medications   Medication Sig Start Date End Date Taking? Authorizing Provider  amoxicillin (AMOXIL) 875 MG tablet Take 1 tablet (875 mg total) 2 (two) times daily by mouth. (Rx void after 01/18/17) 01/11/17   Lattie HawBeese, Loki Wuthrich A, MD  cetirizine (ZYRTEC) 5 MG tablet Take 5 mg by mouth daily.    [provider]  Cholecalciferol (VITAMIN D3) 5000 units CAPS Take by mouth.    [provider]  diphenhydrAMINE (BENADRYL) 25 mg capsule Take 25 mg by mouth every 6 (six) hours as needed for itching.    [provider]  lisinopril (PRINIVIL,ZESTRIL) 20 MG tablet Take by mouth. 10/28/14 10/28/15   [provider]  metoprolol succinate (TOPROL-XL) 100 MG 24 hr tablet Take by mouth. 03/14/15 03/13/16  [provider]  oxyCODONE (OXY IR/ROXICODONE) 5 MG immediate release tablet Take 1-2 tablets (5-10 mg total) by mouth every 3 (three) hours as needed. Patient not taking: Reported on 04/08/2015 05/07/12   Kirtland Bouchardlark, Gilbert W, PA-C  predniSONE (DELTASONE) 20 MG tablet Take one tab by mouth twice daily for 5 days, then one daily. Take with food. 01/11/17   Lattie HawBeese, Yoseph Haile A, MD  Vitamins/Minerals TABS Take by mouth.    [provider]    Family History Family History  Problem Relation Age of Onset  . Diabetes Mother   . Diabetes Father   . Diabetes Brother     Social History Social History   Tobacco Use  . Smoking status: Never Smoker  . Smokeless tobacco: Never Used  Substance Use Topics  . Alcohol use: No  . Drug use: No     Allergies   Codeine   Review of Systems Review of Systems + sore throat + cough No pleuritic pain No wheezing + nasal congestion + post-nasal drainage No sinus pain/pressure No itchy/red eyes No earache No hemoptysis No SOB No fever/chills No nausea No vomiting No abdominal pain No diarrhea No urinary  symptoms No skin rash + fatigue No myalgias No headache Used OTC meds without relief   Physical Exam Triage Vital Signs ED Triage Vitals  Enc Vitals Group     BP 01/11/17 2007 (!) 146/82     Pulse Rate 01/11/17 2007 90     Resp --      Temp 01/11/17 2007 98.5 F (36.9 C)     Temp Source 01/11/17 2007 Oral     SpO2 01/11/17 2007 97 %     Weight 01/11/17 2008 (!) 306 lb (138.8 kg)     Height --      Head Circumference --      Peak Flow --      Pain Score 01/11/17 2008 0     Pain Loc --      Pain Edu? --      Excl. in GC? --    No data found.  Updated Vital Signs BP (!) 146/82 (BP Location: Right Arm)   Pulse 90   Temp 98.5 F (36.9 C) (Oral)   Wt (!) 306 lb (138.8 kg)   SpO2 97%   BMI 41.50 kg/m    Visual Acuity Right Eye Distance:   Left Eye Distance:   Bilateral Distance:    Right Eye Near:   Left Eye Near:    Bilateral Near:     Physical Exam Nursing notes and Vital Signs reviewed. Appearance:  Patient appears stated age, and in no acute distress Eyes:  Pupils are equal, round, and reactive to light and accomodation.  Extraocular movement is intact.  Conjunctivae are not inflamed  Ears:  Canals normal.  Tympanic membranes normal.  Nose:  Congested turbinates.  No sinus tenderness.   Pharynx:  Edematous uvula Neck:  Supple.  Enlarged posterior/lateral nodes are palpated bilaterally, tender to palpation on the left.   Lungs:  Clear to auscultation.  Breath sounds are equal.  Moving air well. Heart:  Regular rate and rhythm without murmurs, rubs, or gallops.  Abdomen:  Nontender without masses or hepatosplenomegaly.  Bowel sounds are present.  No CVA or flank tenderness.  Extremities:  No edema.  Skin:  No rash present.    UC Treatments / Results  Labs (all labs ordered are listed, but only abnormal results are displayed) Labs Reviewed  POCT RAPID STREP A (OFFICE) negative    EKG  EKG Interpretation None       Radiology No results found.  Procedures Procedures (including critical care time)  Medications Ordered in UC Medications - No data to display   Initial Impression / Assessment and Plan / UC Course  I have reviewed the triage vital signs and the nursing notes.  Pertinent labs & imaging results that were available during my care of the patient were reviewed by me and considered in my medical decision making (see chart for details).    There is no evidence of bacterial infection today.   Begin prednisone burst/taper. Take plain guaifenesin (1200mg  extended release tabs such as Mucinex) twice daily, with plenty of water, for cough and congestion.  Get adequate rest.   May use Afrin nasal spray (or generic oxymetazoline) each morning for about 5 days  and then discontinue.  Also recommend using saline nasal spray several times daily and saline nasal irrigation (AYR is a common brand).  Use Flonase nasal spray each morning after using Afrin nasal spray and saline nasal irrigation. Try warm salt water gargles for sore throat.  Stop all antihistamines (Zyrtec, etc.) for  now, and other non-prescription cough/cold preparations. May take Delsym Cough Suppressant at bedtime for nighttime cough.  Begin Amoxicillin if not improving about one week or if persistent fever develops (Given a prescription to hold, with an expiration date)  Follow-up with family doctor if not improving about10 days.     Final Clinical Impressions(s) / UC Diagnoses   Final diagnoses:  Viral URI with cough    ED Discharge Orders        Ordered    amoxicillin (AMOXIL) 875 MG tablet  2 times daily     01/11/17 2019    predniSONE (DELTASONE) 20 MG tablet     01/11/17 2021          Lattie Haw, MD 01/12/17 [phone]

## 2017-01-11 NOTE — Discharge Instructions (Signed)
Take plain guaifenesin (1200mg  extended release tabs such as Mucinex) twice daily, with plenty of water, for cough and congestion.  Get adequate rest.   May use Afrin nasal spray (or generic oxymetazoline) each morning for about 5 days and then discontinue.  Also recommend using saline nasal spray several times daily and saline nasal irrigation (AYR is a common brand).  Use Flonase nasal spray each morning after using Afrin nasal spray and saline nasal irrigation. Try warm salt water gargles for sore throat.  Stop all antihistamines (Zyrtec, etc.) for now, and other non-prescription cough/cold preparations. May take Delsym Cough Suppressant at bedtime for nighttime cough.  Begin Amoxicillin if not improving about one week or if persistent fever develops   Follow-up with family doctor if not improving about10 days.

## 2017-01-13 ENCOUNTER — Telehealth: Payer: Self-pay | Admitting: Emergency Medicine

## 2017-01-13 NOTE — Telephone Encounter (Signed)
Patient states he is doing a lot better.

## 2017-01-22 ENCOUNTER — Encounter: Payer: Self-pay | Admitting: *Deleted

## 2017-01-22 ENCOUNTER — Other Ambulatory Visit: Payer: Self-pay

## 2017-01-22 ENCOUNTER — Emergency Department (INDEPENDENT_AMBULATORY_CARE_PROVIDER_SITE_OTHER)
Admission: EM | Admit: 2017-01-22 | Discharge: 2017-01-22 | Disposition: A | Discharge status: Home / Self Care | Payer: BLUE CROSS/BLUE SHIELD | Source: Home / Self Care | Attending: Family Medicine | Admitting: Family Medicine

## 2017-01-22 DIAGNOSIS — B370 Candidal stomatitis: Secondary | ICD-10-CM | POA: Diagnosis not present

## 2017-01-22 HISTORY — DX: Essential (primary) hypertension: I10

## 2017-01-22 MED ORDER — CLOTRIMAZOLE 10 MG MT TROC: OROMUCOSAL | 0 refills | Status: AC

## 2017-01-22 NOTE — ED Provider Notes (Signed)
[facility]    CSN: [id number] Arrival date & time: 01/22/17  1802     History   Chief Complaint Chief Complaint  Patient presents with  . Sore Throat    HPI Christopher Berry is a 51 y.o. male.   Patient complains of 3 to 4 day history of recurrent sore throat after finishing prednisone on 01/16/17.  He has had thrush in the past after taking prednisone.  He feels well otherwise.   The history is provided by the patient.    Past Medical History:  Diagnosis Date  . Complication of anesthesia    Took a long time to wake up  . Hypertension   . Kidney stones   . Pneumonia    as a child  . Sleep apnea    sleeps with cpap    Patient Active Problem List   Diagnosis Date Noted  . Adjustment disorder with mixed anxiety and depressed mood 04/08/2015  . Difficulty hearing 08/26/2014  . BP (high blood pressure) 08/26/2014  . Morbid (severe) obesity due to excess calories (HCC) 08/26/2014  . 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 Back ache 08/06/2013  . Obstructive apnea 06/19/2012  . Distal radius fracture, right 05/06/2012  . Closed fracture of distal end of radius 05/06/2012    Past Surgical History:  Procedure Laterality Date  . CHOLECYSTECTOMY     in his 2420s  . OPEN REDUCTION INTERNAL FIXATION (ORIF) DISTAL RADIAL FRACTURE Right 05/06/2012   Procedure: OPEN REDUCTION INTERNAL FIXATION (ORIF) Right DISTAL RADIUS FRACTURE;  Surgeon: Kathryne Hitchhristopher Y [name], MD;  Location: [facility] OR;  Service: Orthopedics;  Laterality: Right;       Home Medications    Prior to Admission medications   Medication Sig Start Date End Date Taking? Authorizing Provider  cetirizine (ZYRTEC) 5 MG tablet Take 5 mg by mouth daily.    [provider]  Cholecalciferol (VITAMIN D3) 5000 units CAPS Take by mouth.    [provider]  clotrimazole (MYCELEX) 10 MG troche Slowly dissolve in mouth 5 times/day for one week.  Do not chew or swallow whole. 01/22/17   Lattie HawBeese, Seann Genther A, MD    diphenhydrAMINE (BENADRYL) 25 mg capsule Take 25 mg by mouth every 6 (six) hours as needed for itching.    [provider]  lisinopril (PRINIVIL,ZESTRIL) 20 MG tablet Take by mouth. 10/28/14 10/28/15  [provider]  metoprolol succinate (TOPROL-XL) 100 MG 24 hr tablet Take by mouth. 03/14/15 03/13/16  [provider]  oxyCODONE (OXY IR/ROXICODONE) 5 MG immediate release tablet Take 1-2 tablets (5-10 mg total) by mouth every 3 (three) hours as needed. Patient not taking: Reported on 04/08/2015 05/07/12   Kirtland Bouchardlark, Gilbert W, PA-C  Vitamins/Minerals TABS Take by mouth.    [provider]    Family History Family History  Problem Relation Age of Onset  . Diabetes Mother   . Diabetes Father   . Diabetes Brother     Social History Social History   Tobacco Use  . Smoking status: Never Smoker  . Smokeless tobacco: Never Used  Substance Use Topics  . Alcohol use: No  . Drug use: No     Allergies   Codeine   Review of Systems Review of Systems + sore tongue and throat No cough No pleuritic pain No wheezing No nasal congestion No post-nasal drainage No sinus pain/pressure No itchy/red eyes No earache No hemoptysis No SOB No fever/chills No nausea No vomiting No abdominal pain No diarrhea No urinary  symptoms No skin rash No fatigue No myalgias No headache    Physical Exam Triage Vital Signs ED Triage Vitals  Enc Vitals Group     BP 01/22/17 1826 134/83     Pulse Rate 01/22/17 1826 92     Resp 01/22/17 1826 18     Temp 01/22/17 1826 98.5 F (36.9 C)     Temp Source 01/22/17 1826 Oral     SpO2 01/22/17 1826 98 %     Weight 01/22/17 1826 (!) 303 lb (137.4 kg)     Height 01/22/17 1826 5\' 11"  (1.803 m)     Head Circumference --      Peak Flow --      Pain Score 01/22/17 1827 0     Pain Loc --      Pain Edu? --      Excl. in GC? --    No data found.  Updated Vital Signs BP 134/83 (BP Location: Left Arm)   Pulse 92   Temp 98.5  F (36.9 C) (Oral)   Resp 18   Ht 5\' 11"  (1.803 m)   Wt (!) 303 lb (137.4 kg)   SpO2 98%   BMI 42.26 kg/m   Visual Acuity Right Eye Distance:   Left Eye Distance:   Bilateral Distance:    Right Eye Near:   Left Eye Near:    Bilateral Near:     Physical Exam  Constitutional: He appears well-developed and well-nourished. No distress.  HENT:  Head: Normocephalic.  Right Ear: Tympanic membrane and ear canal normal.  Left Ear: Tympanic membrane and ear canal normal.  Mouth/Throat: Uvula is midline. No oropharyngeal exudate.  Mild pharyngeal and uvula erythema; no exudate or plaques present.  Eyes: Pupils are equal, round, and reactive to light.  Neck: Neck supple.  Cardiovascular: Normal rate.  Pulmonary/Chest: Effort normal.  Lymphadenopathy:    He has no cervical adenopathy.  Neurological: He is alert.  Skin: Skin is warm and dry.  Nursing note and vitals reviewed.    UC Treatments / Results  Labs (all labs ordered are listed, but only abnormal results are displayed) Labs Reviewed - POCT KOH prep:  Hyphae present  EKG  EKG Interpretation None       Radiology No results found.  Procedures Procedures (including critical care time)  Medications Ordered in UC Medications - No data to display   Initial Impression / Assessment and Plan / UC Course  I have reviewed the triage vital signs and the nursing notes.  Pertinent labs & imaging results that were available during my care of the patient were reviewed by me and considered in my medical decision making (see chart for details).    Begin Mycelex Troches for one week. Followup with Family Doctor if not improved in one week.     Final Clinical Impressions(s) / UC Diagnoses   Final diagnoses:  Oral candidiasis    ED Discharge Orders        Ordered    clotrimazole (MYCELEX) 10 MG troche     01/22/17 1852           Lattie HawBeese, Raymonda Pell A, MD 01/24/17 1932

## 2017-01-22 NOTE — ED Triage Notes (Signed)
Pt c/o sore throat after completing Prednisone on 01/16/17. He reports a hx of thrush after completing prednisone in the past.
